# Patient Record
Sex: Female | Born: 1935 | Race: White | Hispanic: No | Smoking: Former smoker
Health system: Southern US, Community
[De-identification: ages and names within clinical notes are randomized; demographics above are authoritative.]

## PROBLEM LIST (undated history)

## (undated) DIAGNOSIS — D649 Anemia, unspecified: Secondary | ICD-10-CM

## (undated) DIAGNOSIS — J449 Chronic obstructive pulmonary disease, unspecified: Secondary | ICD-10-CM

## (undated) DIAGNOSIS — K279 Peptic ulcer, site unspecified, unspecified as acute or chronic, without hemorrhage or perforation: Secondary | ICD-10-CM

## (undated) DIAGNOSIS — E039 Hypothyroidism, unspecified: Secondary | ICD-10-CM

## (undated) HISTORY — DX: Chronic obstructive pulmonary disease, unspecified: J44.9

## (undated) HISTORY — DX: Anemia, unspecified: D64.9

## (undated) HISTORY — DX: Peptic ulcer, site unspecified, unspecified as acute or chronic, without hemorrhage or perforation: K27.9

## (undated) HISTORY — DX: Hypothyroidism, unspecified: E03.9

---

## 2002-03-04 ENCOUNTER — Emergency Department (HOSPITAL_COMMUNITY): Admission: EM | Admit: 2002-03-04 | Discharge: 2002-03-04 | Payer: Self-pay | Admitting: Emergency Medicine

## 2002-07-19 ENCOUNTER — Encounter: Payer: Self-pay | Admitting: Family Medicine

## 2002-07-19 ENCOUNTER — Encounter: Admission: RE | Admit: 2002-07-19 | Discharge: 2002-07-19 | Payer: Self-pay | Admitting: Family Medicine

## 2002-11-25 ENCOUNTER — Inpatient Hospital Stay (HOSPITAL_COMMUNITY): Admission: EM | Admit: 2002-11-25 | Discharge: 2002-11-30 | Payer: Self-pay | Admitting: Emergency Medicine

## 2002-11-26 ENCOUNTER — Encounter: Payer: Self-pay | Admitting: Gastroenterology

## 2003-04-23 ENCOUNTER — Other Ambulatory Visit: Admission: RE | Admit: 2003-04-23 | Discharge: 2003-04-23 | Payer: Self-pay | Admitting: Family Medicine

## 2003-12-27 ENCOUNTER — Inpatient Hospital Stay (HOSPITAL_COMMUNITY): Admission: EM | Admit: 2003-12-27 | Discharge: 2003-12-30 | Payer: Self-pay | Admitting: Emergency Medicine

## 2004-01-06 ENCOUNTER — Encounter: Admission: RE | Admit: 2004-01-06 | Discharge: 2004-01-06 | Payer: Self-pay | Admitting: Family Medicine

## 2004-01-16 ENCOUNTER — Inpatient Hospital Stay (HOSPITAL_COMMUNITY): Admission: EM | Admit: 2004-01-16 | Discharge: 2004-01-20 | Payer: Self-pay | Admitting: Emergency Medicine

## 2004-04-21 ENCOUNTER — Inpatient Hospital Stay (HOSPITAL_COMMUNITY): Admission: EM | Admit: 2004-04-21 | Discharge: 2004-04-25 | Payer: Self-pay | Admitting: Emergency Medicine

## 2004-04-22 ENCOUNTER — Encounter: Payer: Self-pay | Admitting: Cardiology

## 2004-07-08 ENCOUNTER — Ambulatory Visit (HOSPITAL_COMMUNITY): Admission: RE | Admit: 2004-07-08 | Discharge: 2004-07-08 | Payer: Self-pay | Admitting: Specialist

## 2004-08-12 ENCOUNTER — Ambulatory Visit (HOSPITAL_COMMUNITY): Admission: RE | Admit: 2004-08-12 | Discharge: 2004-08-12 | Payer: Self-pay | Admitting: Specialist

## 2004-09-01 ENCOUNTER — Encounter: Admission: RE | Admit: 2004-09-01 | Discharge: 2004-09-01 | Payer: Self-pay | Admitting: Family Medicine

## 2005-03-08 ENCOUNTER — Ambulatory Visit (HOSPITAL_COMMUNITY): Admission: RE | Admit: 2005-03-08 | Discharge: 2005-03-08 | Payer: Self-pay | Admitting: Family Medicine

## 2007-01-10 ENCOUNTER — Inpatient Hospital Stay (HOSPITAL_COMMUNITY): Admission: EM | Admit: 2007-01-10 | Discharge: 2007-01-13 | Payer: Self-pay | Admitting: Emergency Medicine

## 2007-01-27 ENCOUNTER — Encounter: Admission: RE | Admit: 2007-01-27 | Discharge: 2007-01-27 | Payer: Self-pay | Admitting: Family Medicine

## 2007-02-06 ENCOUNTER — Ambulatory Visit: Payer: Self-pay | Admitting: Critical Care Medicine

## 2007-05-05 ENCOUNTER — Ambulatory Visit: Payer: Self-pay | Admitting: Internal Medicine

## 2007-07-13 ENCOUNTER — Encounter: Admission: RE | Admit: 2007-07-13 | Discharge: 2007-07-13 | Payer: Self-pay | Admitting: Family Medicine

## 2008-07-15 ENCOUNTER — Encounter (INDEPENDENT_AMBULATORY_CARE_PROVIDER_SITE_OTHER): Payer: Self-pay | Admitting: *Deleted

## 2008-07-15 ENCOUNTER — Ambulatory Visit: Payer: Self-pay | Admitting: Vascular Surgery

## 2008-07-15 ENCOUNTER — Inpatient Hospital Stay (HOSPITAL_COMMUNITY): Admission: EM | Admit: 2008-07-15 | Discharge: 2008-07-19 | Payer: Self-pay | Admitting: Emergency Medicine

## 2008-08-13 ENCOUNTER — Inpatient Hospital Stay (HOSPITAL_COMMUNITY): Admission: RE | Admit: 2008-08-13 | Discharge: 2008-08-14 | Payer: Self-pay | Admitting: Interventional Radiology

## 2008-08-28 ENCOUNTER — Encounter: Payer: Self-pay | Admitting: Interventional Radiology

## 2009-02-20 ENCOUNTER — Ambulatory Visit: Payer: Self-pay | Admitting: Surgery

## 2009-02-20 ENCOUNTER — Encounter (INDEPENDENT_AMBULATORY_CARE_PROVIDER_SITE_OTHER): Payer: Self-pay | Admitting: Interventional Radiology

## 2009-02-20 ENCOUNTER — Ambulatory Visit (HOSPITAL_COMMUNITY): Admission: RE | Admit: 2009-02-20 | Discharge: 2009-02-20 | Payer: Self-pay | Admitting: Rheumatology

## 2009-08-13 IMAGING — XA IR TRANSCATH EXCRAN VERT OR CAR A STENT
2 of 3 series · 14 of 24 positions shown · non-contrast
Comparison: Angiogram of 07/17/2008.

CLINICAL DATA: Vertebrobasilar ischemia.  Severe bilateral
vertebral artery atherosclerotic stenoses.

RIGHT VERTEBRAL ARTERY EXTRACRANIAL STENT ASSISTED ANGIOPLASTY

[Series 1: run · 13 of 171 slices shown (1 of 2)]
[im 1/171]
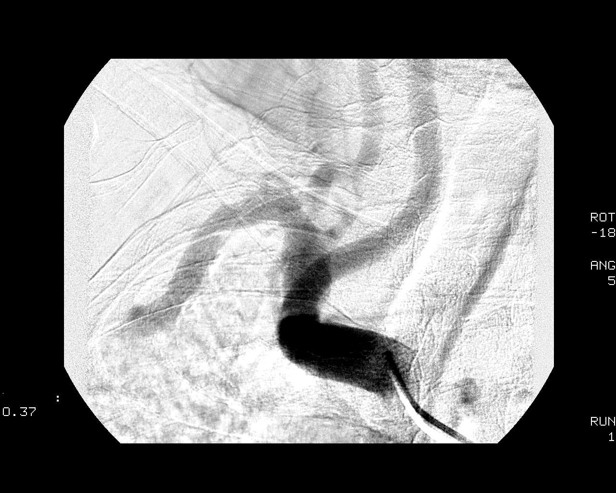
[im 17/171]
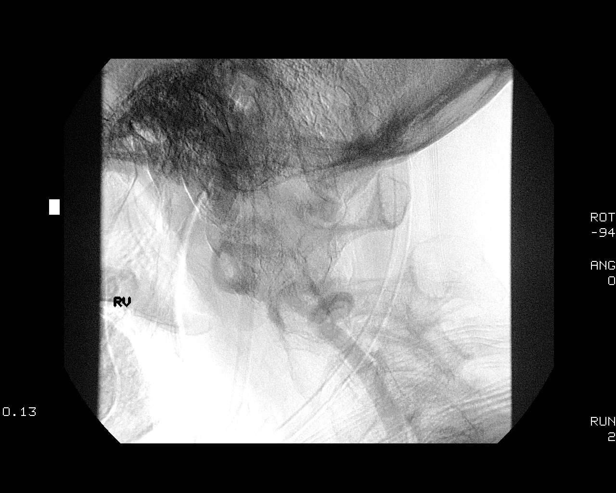
[im 33/171]
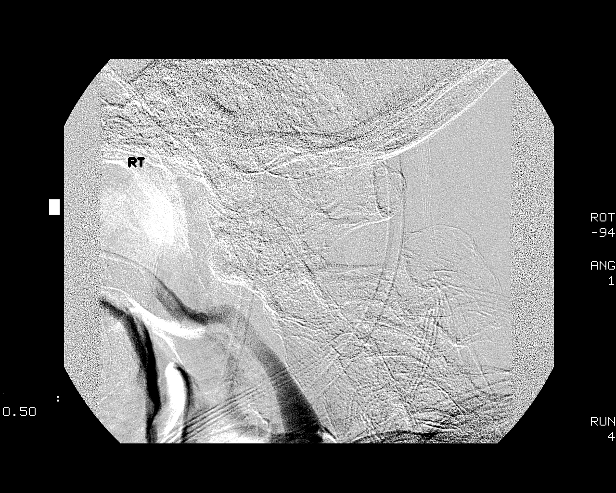
[im 49/171]
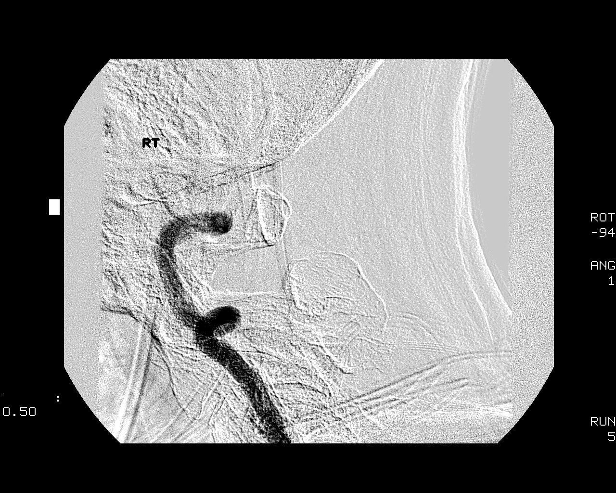
[im 57/171]
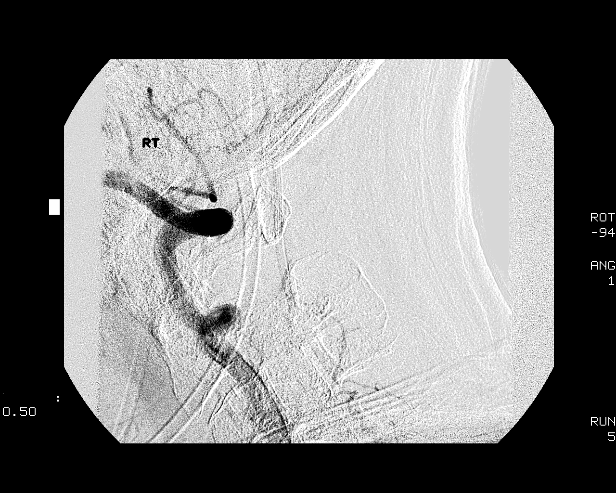
[im 73/171]
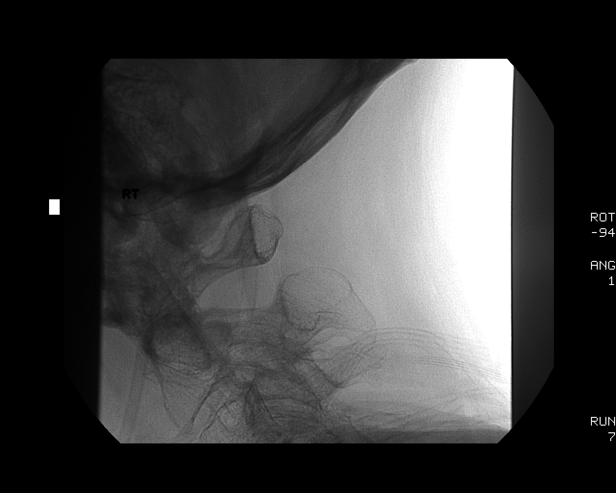
[im 90/171]
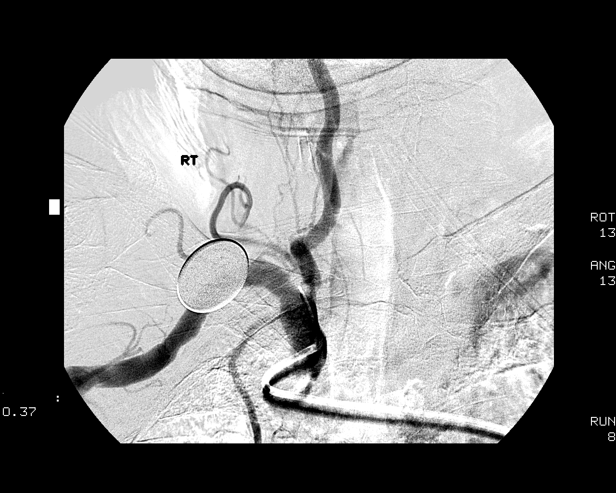
[im 98/171]
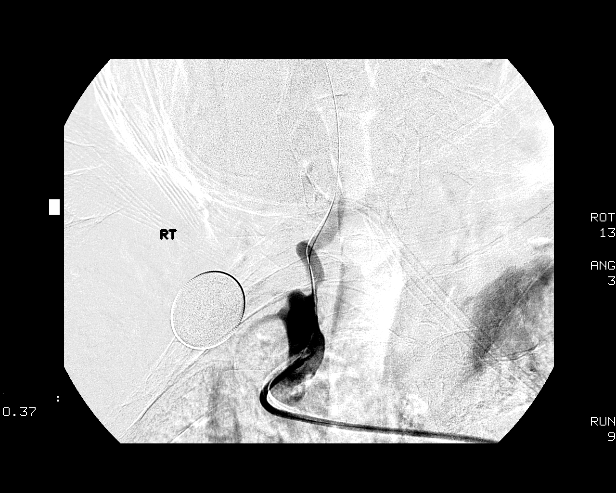
[im 114/171]
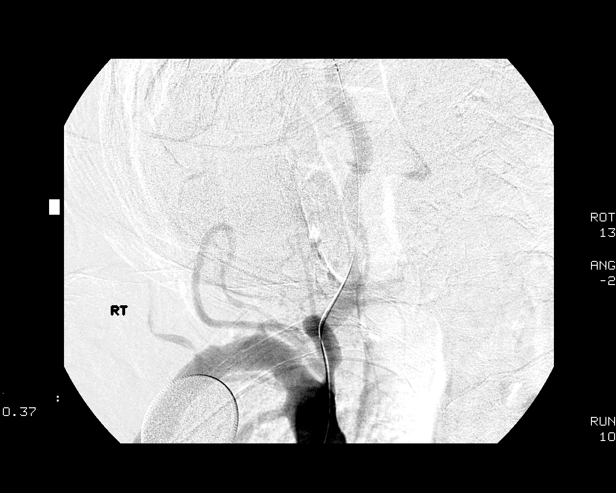
[im 130/171]
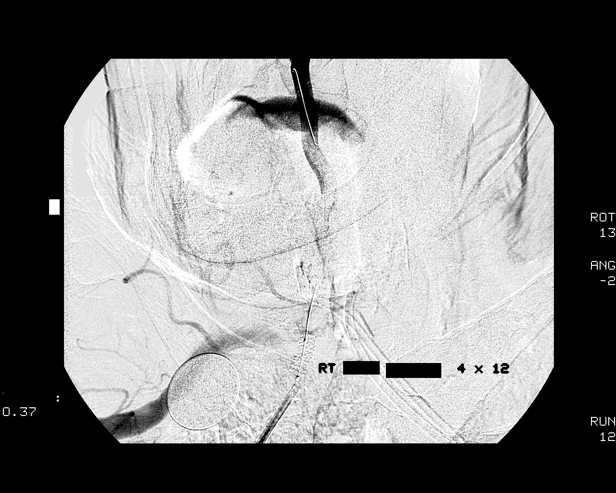
[im 146/171]
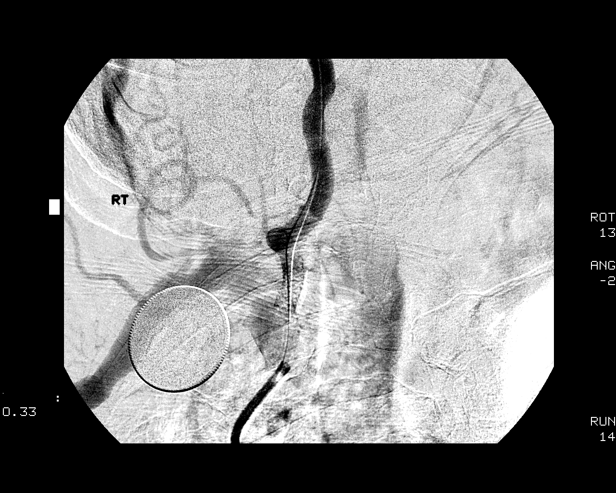
[im 154/171]
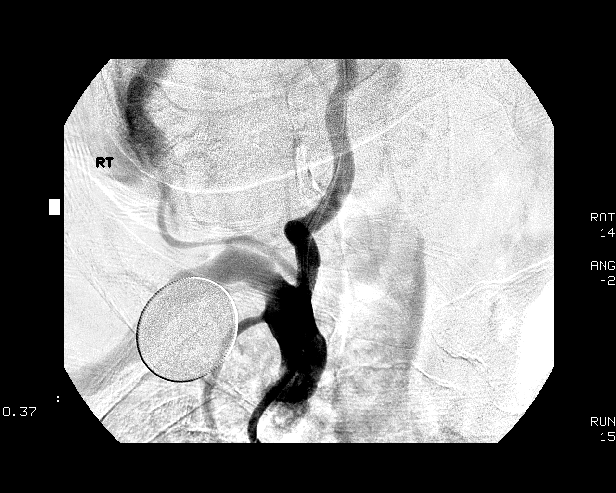
[im 171/171]
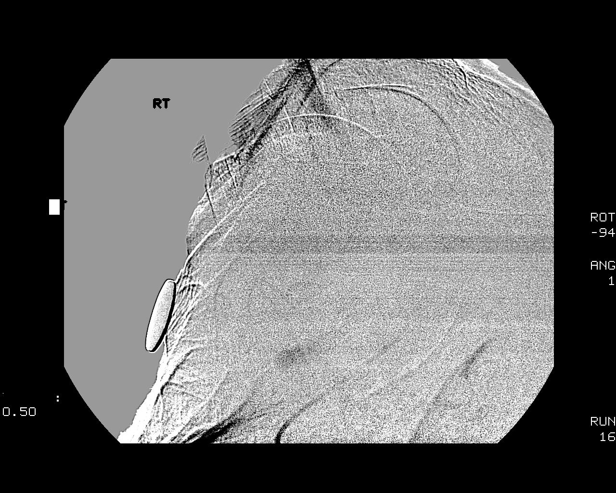

[Series 3: run · 1 of 6 slices shown (2 of 2)]
[im 1/6]
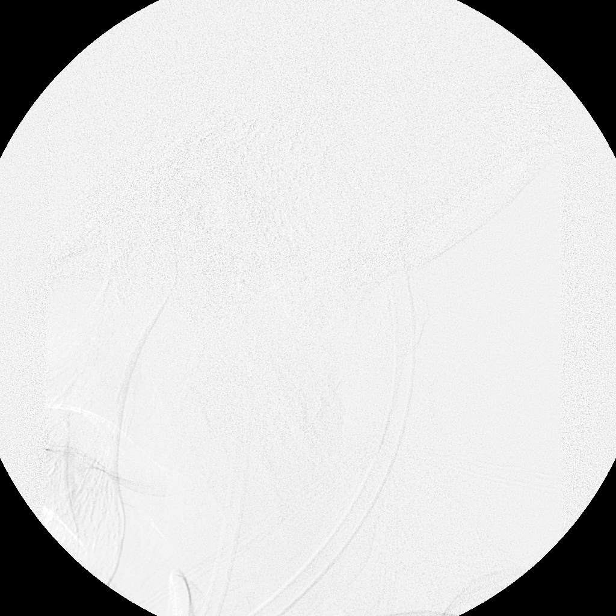

[14 of 24 positions shown; findings below may reference images not displayed]

Following a full explanation of the procedure along with potential
associated complications, an informed witnessed consent was
obtained.

The right groin was prepped and draped in the usual sterile
fashion.  Thereafter using a modified Seldinger technique,
transfemoral access into the right common femoral artery was
obtained without difficulty.  Over a 0.035-inch guidewire, 5-French
Pinnacle sheath was inserted.  Through this and also over a 0.035-
inch guidewire, a 5-French JB1 catheter was advanced to the aortic
arch region and selectively positioned in the right subclavian
artery just proximal to the origin of the right vertebral artery.

An arteriogram was then performed which confirmed the severe focal
stenosis at the origin of the right vertebral artery associated
with post-stenotic dilatation.

Distal flow into the right vertebrobasilar junction and the basilar
artery was noted to be normal.

A quarter was then positioned over the right clavicle medially and
measurements were performed of the right vertebral artery in the
diseased segment proximally.

The vessel measured 4.3 mm distal to the stenosis, just distal to
the post-stenotic dilatation.  The tight focal area measured
mm.  The length of the diseased segment measured less than 10 mm.

Using biplane roadmap technique and constant fluoroscopic guidance,
the diagnostic JB1 catheter was advanced into the distal subclavian
artery and exchanged over a 0.035-inch 300 centimeter Rosen
exchange guidewire for a 6-French 45 cm neurovascular sheath.  The
neurovascular sheath was connected to continuous heparinized saline
infusion.

Over the Rosen exchange guidewire, a 6-French 100 cm Quattrocchi guide
catheter was advanced and positioned with its distal end just
proximal to the origin of the right vertebral artery.

The exchange guidewire was removed.  Good aspiration was obtained
from the hub of the 6-French guide catheter.

A gentle contrast injection demonstrated no evidence of spasm,
dissections or of intraluminal filling defects.

Using biplane roadmap technique and constant fluoroscopic guidance,
in a coaxial manner and with constant heparinized saline infusion,
a Rapid Transit two-marker microcatheter which had been steam-
shaped was advanced over a 0.014-inch Transend Softip
microguidewire to the distal end of the guide catheter.  With the
microguidewire leading with a J-tip configuration to avoid
dissections or inducing spasm, the combination was navigated with
torque device through the tight focal area of stenosis and advanced
into the distal vertical portion of the right vertebral artery.
This was then followed by the microcatheter without difficulty.

The guidewire was removed.  Good aspiration was obtained from the
hub of the microcatheter.  This was then exchanged for a 300 cm
0.014-inch BMW exchange microguidewire.  The microguidewire had a J
configuration to avoid dissections or inducing spasm.

A control arteriogram performed through the 6-French guide catheter
demonstrated no change in the extracranial or intracranial right
vertebral artery flow.

At this time in a coaxial manner and with constant heparinized
saline infusion, using roadmap technique and constant fluoroscopic
guidance, a PROMUS drug eluting stent was advanced using the Rapid
Exchange system to the distal end of the guide catheter.  The
combination was then advanced such that the balloon-mounted stent
was now positioned optimally with the proximal and the distal
markers equidistant from the focal area of severe stenosis.

The stent was then deployed by inflating the balloon via
microtubing and micro inflation device system.  The 4 mm x 12 mm
drug-eluting stent was expanded to 13 atmospheres to a diameter of
4.23 mm.  Once there, it was maintained for approximately 15
seconds.

The balloon was then deflated and retrieved proximally.  A control
arteriogram performed through the 6-French guide catheter
demonstrated excellent apposition of the stent proximally and
distally with significantly improved caliber and flow through the
stented segment.  There was a less than 10% residual stenosis
within the stented portion.

Control arteriograms were then performed at [DATE] and 40 minutes
after positioning of the stent. The exchange microguidewire was
maintained distally.  The control arteriograms continued to
demonstrate excellent flow through the stented portion.  At the end
of 40 minutes, the wire was retrieved under fluoroscopic guidance
to ensure no entanglement with the stent.  None was observed.

The 6-French guide catheter was then retrieved into the abdominal
aorta and exchanged over a J-tip guidewire for a 7-French Pinnacle
sheath.  This was then connected to continuous heparinized saline
infusion.

Throughout the procedure the patient's neurological status remained
stable.  No hemodynamic consequences were noted.  The patient's ACT
was maintained in the 250-300 seconds range using IV heparin.

The patient was then transported to the Neuro ICU for overnight
neurological observations and maintained on IV heparin.

IMPRESSION
1.  Status post stent-assisted angioplasty of severely stenotic
dominant right vertebral artery at its origin, using a balloon
mounted drug-eluting stent.
2.  The patient's overnight stay in the NICU was unremarkable.  The
heparin was stopped the following day.  The right groin sheath was
retrieved and removed.  The patient's neurological and hemodynamic
status remained stable.  Six hours after sheath pull, the patient
was ambulatory and able to eat without difficulty.  She was then
discharged under the care of her daughters.  She will continue to
take her aspirin and Plavix, including her other medications for
now.  She will return to the clinic in 2 weeks.

## 2010-03-25 ENCOUNTER — Ambulatory Visit: Payer: Self-pay | Admitting: Surgery

## 2010-03-25 ENCOUNTER — Ambulatory Visit (HOSPITAL_COMMUNITY): Admission: RE | Admit: 2010-03-25 | Discharge: 2010-03-25 | Payer: Self-pay | Admitting: Family Medicine

## 2010-07-12 ENCOUNTER — Encounter (INDEPENDENT_AMBULATORY_CARE_PROVIDER_SITE_OTHER): Payer: Self-pay | Admitting: Internal Medicine

## 2010-07-15 ENCOUNTER — Encounter (INDEPENDENT_AMBULATORY_CARE_PROVIDER_SITE_OTHER): Payer: Self-pay | Admitting: Internal Medicine

## 2010-07-15 ENCOUNTER — Ambulatory Visit: Payer: Self-pay | Admitting: Vascular Surgery

## 2010-08-20 ENCOUNTER — Inpatient Hospital Stay (HOSPITAL_COMMUNITY): Admission: EM | Admit: 2010-08-20 | Discharge: 2010-08-22 | Payer: Self-pay | Admitting: Emergency Medicine

## 2010-09-13 ENCOUNTER — Inpatient Hospital Stay (HOSPITAL_COMMUNITY): Admission: EM | Admit: 2010-09-13 | Discharge: 2010-09-18 | Payer: Self-pay | Admitting: Internal Medicine

## 2010-09-13 ENCOUNTER — Encounter: Payer: Self-pay | Admitting: Emergency Medicine

## 2010-09-21 ENCOUNTER — Inpatient Hospital Stay (HOSPITAL_COMMUNITY)
Admission: EM | Admit: 2010-09-21 | Discharge: 2010-09-24 | Disposition: A | Discharge status: Other Acute Inpatient Hospital | Payer: Self-pay | Source: Home / Self Care | Attending: Internal Medicine | Admitting: Internal Medicine

## 2010-09-22 ENCOUNTER — Encounter (INDEPENDENT_AMBULATORY_CARE_PROVIDER_SITE_OTHER): Payer: Self-pay | Admitting: Internal Medicine

## 2010-09-23 ENCOUNTER — Encounter (INDEPENDENT_AMBULATORY_CARE_PROVIDER_SITE_OTHER): Payer: Self-pay | Admitting: Internal Medicine

## 2010-09-24 ENCOUNTER — Inpatient Hospital Stay (HOSPITAL_COMMUNITY): Admission: EM | Admit: 2010-09-24 | Discharge: 2010-07-17 | Payer: Self-pay | Admitting: Emergency Medicine

## 2010-12-28 LAB — TROPONIN I: Troponin I: 0.09 ng/mL — ABNORMAL HIGH (ref 0.00–0.06)

## 2010-12-28 LAB — CBC
HCT: 25.2 % — ABNORMAL LOW (ref 36.0–46.0)
HCT: 25.4 % — ABNORMAL LOW (ref 36.0–46.0)
HCT: 25.5 % — ABNORMAL LOW (ref 36.0–46.0)
HCT: 30.3 % — ABNORMAL LOW (ref 36.0–46.0)
Hemoglobin: 8.2 g/dL — ABNORMAL LOW (ref 12.0–15.0)
Hemoglobin: 8.2 g/dL — ABNORMAL LOW (ref 12.0–15.0)
Hemoglobin: 8.3 g/dL — ABNORMAL LOW (ref 12.0–15.0)
Hemoglobin: 9.9 g/dL — ABNORMAL LOW (ref 12.0–15.0)
MCH: 29.4 pg (ref 26.0–34.0)
MCHC: 32.3 g/dL (ref 30.0–36.0)
MCHC: 32.7 g/dL (ref 30.0–36.0)
MCV: 89.9 fL (ref 78.0–100.0)
MCV: 90.4 fL (ref 78.0–100.0)
RBC: 2.82 MIL/uL — ABNORMAL LOW (ref 3.87–5.11)
RDW: 14.5 % (ref 11.5–15.5)
WBC: 10 10*3/uL (ref 4.0–10.5)
WBC: 8.5 10*3/uL (ref 4.0–10.5)
WBC: 9.8 10*3/uL (ref 4.0–10.5)

## 2010-12-28 LAB — CARDIAC PANEL(CRET KIN+CKTOT+MB+TROPI)
CK, MB: 1.3 ng/mL (ref 0.3–4.0)
Total CK: 19 U/L (ref 7–177)
Troponin I: 0.07 ng/mL — ABNORMAL HIGH (ref 0.00–0.06)

## 2010-12-28 LAB — COMPREHENSIVE METABOLIC PANEL
ALT: 37 U/L — ABNORMAL HIGH (ref 0–35)
AST: 49 U/L — ABNORMAL HIGH (ref 0–37)
Alkaline Phosphatase: 80 U/L (ref 39–117)
GFR calc Af Amer: >60 mL/min (ref >60)
Glucose, Bld: 88 mg/dL (ref 70–99)
Potassium: 4 mEq/L (ref 3.5–5.1)
Sodium: 136 mEq/L (ref 135–145)
Total Protein: 4.4 g/dL — ABNORMAL LOW (ref 6.0–8.3)

## 2010-12-28 LAB — URINE CULTURE
Colony Count: NO GROWTH
Culture  Setup Time: 201112060140
Culture: NO GROWTH

## 2010-12-28 LAB — URINALYSIS, ROUTINE W REFLEX MICROSCOPIC
Bilirubin Urine: NEGATIVE
Glucose, UA: NEGATIVE mg/dL
Ketones, ur: NEGATIVE mg/dL
Nitrite: NEGATIVE
pH: 7 (ref 5.0–8.0)

## 2010-12-28 LAB — DIFFERENTIAL
Lymphocytes Relative: 5 % — ABNORMAL LOW (ref 12–46)
Lymphs Abs: 0.7 10*3/uL (ref 0.7–4.0)
Monocytes Absolute: 1 10*3/uL (ref 0.1–1.0)
Monocytes Relative: 8 % (ref 3–12)
Neutro Abs: 10.8 10*3/uL — ABNORMAL HIGH (ref 1.7–7.7)

## 2010-12-28 LAB — BASIC METABOLIC PANEL
CO2: 24 mEq/L (ref 19–32)
Calcium: 9.5 mg/dL (ref 8.4–10.5)
Chloride: 105 mEq/L (ref 96–112)
Chloride: 105 mEq/L (ref 96–112)
GFR calc Af Amer: >60 mL/min (ref >60)
GFR calc non Af Amer: 58 mL/min — ABNORMAL LOW (ref >60)
Glucose, Bld: 103 mg/dL — ABNORMAL HIGH (ref 70–99)
Potassium: 4.1 mEq/L (ref 3.5–5.1)
Sodium: 135 mEq/L (ref 135–145)
Sodium: 136 mEq/L (ref 135–145)

## 2010-12-28 LAB — TYPE AND SCREEN
ABO/RH(D): A POS
Antibody Screen: NEGATIVE

## 2010-12-28 LAB — CULTURE, BLOOD (ROUTINE X 2)
Culture  Setup Time: 201112060904
Culture: NO GROWTH

## 2010-12-28 LAB — IRON AND TIBC
Iron: 29 ug/dL — ABNORMAL LOW (ref 42–135)
Saturation Ratios: 17 % — ABNORMAL LOW (ref 20–55)
TIBC: 173 ug/dL — ABNORMAL LOW (ref 250–470)

## 2010-12-28 LAB — POCT CARDIAC MARKERS: Troponin i, poc: <0.05 ng/mL (ref 0.00–0.09)

## 2010-12-28 LAB — HEPARIN LEVEL (UNFRACTIONATED)
Heparin Unfractionated: 0.2 IU/mL — ABNORMAL LOW (ref 0.30–0.70)
Heparin Unfractionated: 0.45 IU/mL (ref 0.30–0.70)
Heparin Unfractionated: 0.71 IU/mL — ABNORMAL HIGH (ref 0.30–0.70)
Heparin Unfractionated: 0.74 IU/mL — ABNORMAL HIGH (ref 0.30–0.70)

## 2010-12-28 LAB — MAGNESIUM: Magnesium: 1.6 mg/dL (ref 1.5–2.5)

## 2010-12-28 LAB — PROTIME-INR: INR: 1.14 (ref 0.00–1.49)

## 2010-12-28 LAB — CK TOTAL AND CKMB (NOT AT ARMC)
CK, MB: 1.3 ng/mL (ref 0.3–4.0)
Relative Index: INVALID (ref 0.0–2.5)
Total CK: 22 U/L (ref 7–177)

## 2010-12-28 LAB — RETICULOCYTES
RBC.: 3.11 MIL/uL — ABNORMAL LOW (ref 3.87–5.11)
Retic Count, Absolute: 74.6 10*3/uL (ref 19.0–186.0)

## 2010-12-28 LAB — HEMOCCULT GUIAC POC 1CARD (OFFICE): Fecal Occult Bld: NEGATIVE

## 2010-12-28 LAB — TSH: TSH: 5.392 u[IU]/mL — ABNORMAL HIGH (ref 0.350–4.500)

## 2010-12-28 LAB — D-DIMER, QUANTITATIVE: D-Dimer, Quant: 3.22 ug/mL-FEU — ABNORMAL HIGH (ref 0.00–0.48)

## 2010-12-28 LAB — APTT: aPTT: 33 seconds (ref 24–37)

## 2010-12-29 LAB — RETICULOCYTES
Retic Count, Absolute: 27.8 10*3/uL (ref 19.0–186.0)
Retic Ct Pct: 1 % (ref 0.4–3.1)
Retic Ct Pct: 3.9 % — ABNORMAL HIGH (ref 0.4–3.1)

## 2010-12-29 LAB — BASIC METABOLIC PANEL
CO2: 23 mEq/L (ref 19–32)
CO2: 26 mEq/L (ref 19–32)
Calcium: 8.4 mg/dL (ref 8.4–10.5)
Calcium: 8.8 mg/dL (ref 8.4–10.5)
Chloride: 108 mEq/L (ref 96–112)
Chloride: 108 mEq/L (ref 96–112)
Creatinine, Ser: 1.04 mg/dL (ref 0.4–1.2)
GFR calc Af Amer: >60 mL/min (ref >60)
GFR calc Af Amer: 55 mL/min — ABNORMAL LOW (ref >60)
GFR calc Af Amer: 56 mL/min — ABNORMAL LOW (ref >60)
GFR calc Af Amer: >60 mL/min (ref >60)
GFR calc non Af Amer: 45 mL/min — ABNORMAL LOW (ref >60)
GFR calc non Af Amer: 47 mL/min — ABNORMAL LOW (ref >60)
Glucose, Bld: 117 mg/dL — ABNORMAL HIGH (ref 70–99)
Glucose, Bld: 87 mg/dL (ref 70–99)
Potassium: 4.4 mEq/L (ref 3.5–5.1)
Potassium: 4.4 mEq/L (ref 3.5–5.1)
Potassium: 4.6 mEq/L (ref 3.5–5.1)
Potassium: 4.8 mEq/L (ref 3.5–5.1)
Sodium: 140 mEq/L (ref 135–145)
Sodium: 138 mEq/L (ref 135–145)
Sodium: 139 mEq/L (ref 135–145)
Sodium: 141 mEq/L (ref 135–145)
Sodium: 142 mEq/L (ref 135–145)

## 2010-12-29 LAB — COMPREHENSIVE METABOLIC PANEL
ALT: 14 U/L (ref 0–35)
Albumin: 1.9 g/dL — ABNORMAL LOW (ref 3.5–5.2)
Alkaline Phosphatase: 60 U/L (ref 39–117)
BUN: 18 mg/dL (ref 6–23)
BUN: 17 mg/dL (ref 6–23)
Calcium: 9.1 mg/dL (ref 8.4–10.5)
Chloride: 110 mEq/L (ref 96–112)
Glucose, Bld: 96 mg/dL (ref 70–99)
Glucose, Bld: 109 mg/dL — ABNORMAL HIGH (ref 70–99)
Potassium: 4.6 mEq/L (ref 3.5–5.1)
Sodium: 137 mEq/L (ref 135–145)
Total Bilirubin: 0.7 mg/dL (ref 0.3–1.2)
Total Protein: 4.2 g/dL — ABNORMAL LOW (ref 6.0–8.3)
Total Protein: 5.4 g/dL — ABNORMAL LOW (ref 6.0–8.3)

## 2010-12-29 LAB — CBC
HCT: 24.8 % — ABNORMAL LOW (ref 36.0–46.0)
HCT: 24.4 % — ABNORMAL LOW (ref 36.0–46.0)
HCT: 24.6 % — ABNORMAL LOW (ref 36.0–46.0)
HCT: 27.2 % — ABNORMAL LOW (ref 36.0–46.0)
HCT: 28.9 % — ABNORMAL LOW (ref 36.0–46.0)
Hemoglobin: 9.4 g/dL — ABNORMAL LOW (ref 12.0–15.0)
Hemoglobin: 8.3 g/dL — ABNORMAL LOW (ref 12.0–15.0)
Hemoglobin: 9 g/dL — ABNORMAL LOW (ref 12.0–15.0)
Hemoglobin: 9.6 g/dL — ABNORMAL LOW (ref 12.0–15.0)
MCH: 30 pg (ref 26.0–34.0)
MCH: 30.9 pg (ref 26.0–34.0)
MCHC: 32.2 g/dL (ref 30.0–36.0)
MCHC: 33.1 g/dL (ref 30.0–36.0)
MCV: 90.2 fL (ref 78.0–100.0)
MCV: 91.3 fL (ref 78.0–100.0)
MCV: 92.7 fL (ref 78.0–100.0)
MCV: 97.4 fL (ref 78.0–100.0)
Platelets: 254 10*3/uL (ref 150–400)
Platelets: 291 10*3/uL (ref 150–400)
Platelets: 311 10*3/uL (ref 150–400)
Platelets: 314 10*3/uL (ref 150–400)
RBC: 2.75 MIL/uL — ABNORMAL LOW (ref 3.87–5.11)
RBC: 3.16 MIL/uL — ABNORMAL LOW (ref 3.87–5.11)
RBC: 3.03 MIL/uL — ABNORMAL LOW (ref 3.87–5.11)
RBC: 3.13 MIL/uL — ABNORMAL LOW (ref 3.87–5.11)
RDW: 14.3 % (ref 11.5–15.5)
RDW: 14.4 % (ref 11.5–15.5)
RDW: 15.2 % (ref 11.5–15.5)
RDW: 17.4 % — ABNORMAL HIGH (ref 11.5–15.5)
RDW: 18.4 % — ABNORMAL HIGH (ref 11.5–15.5)
WBC: 11.8 10*3/uL — ABNORMAL HIGH (ref 4.0–10.5)
WBC: 11.6 10*3/uL — ABNORMAL HIGH (ref 4.0–10.5)
WBC: 15.1 10*3/uL — ABNORMAL HIGH (ref 4.0–10.5)
WBC: 15.8 10*3/uL — ABNORMAL HIGH (ref 4.0–10.5)
WBC: 8 10*3/uL (ref 4.0–10.5)

## 2010-12-29 LAB — URINALYSIS, ROUTINE W REFLEX MICROSCOPIC
Glucose, UA: NEGATIVE mg/dL
Hgb urine dipstick: NEGATIVE
Specific Gravity, Urine: 1.022 (ref 1.005–1.030)
pH: 6 (ref 5.0–8.0)

## 2010-12-29 LAB — FOLATE: Folate: >20 ng/mL

## 2010-12-29 LAB — DIFFERENTIAL
Basophils Relative: 0 % (ref 0–1)
Eosinophils Absolute: 0.1 10*3/uL (ref 0.0–0.7)
Eosinophils Absolute: 0.2 10*3/uL (ref 0.0–0.7)
Lymphs Abs: 1 10*3/uL (ref 0.7–4.0)
Lymphs Abs: 0.9 10*3/uL (ref 0.7–4.0)
Lymphs Abs: 1.1 10*3/uL (ref 0.7–4.0)
Monocytes Relative: 6 % (ref 3–12)
Monocytes Relative: 9 % (ref 3–12)
Neutro Abs: 9.7 10*3/uL — ABNORMAL HIGH (ref 1.7–7.7)
Neutro Abs: 7.2 10*3/uL (ref 1.7–7.7)
Neutrophils Relative %: 84 % — ABNORMAL HIGH (ref 43–77)
Neutrophils Relative %: 80 % — ABNORMAL HIGH (ref 43–77)
Neutrophils Relative %: 80 % — ABNORMAL HIGH (ref 43–77)

## 2010-12-29 LAB — CROSSMATCH
ABO/RH(D): A POS
ABO/RH(D): A POS
Antibody Screen: NEGATIVE
Unit division: 0
Unit division: 0

## 2010-12-29 LAB — FERRITIN: Ferritin: 153 ng/mL (ref 10–291)

## 2010-12-29 LAB — IRON AND TIBC
Iron: 74 ug/dL (ref 42–135)
Saturation Ratios: 33 % (ref 20–55)
TIBC: 227 ug/dL — ABNORMAL LOW (ref 250–470)

## 2010-12-29 LAB — CARDIAC PANEL(CRET KIN+CKTOT+MB+TROPI)
CK, MB: 1 ng/mL (ref 0.3–4.0)
Troponin I: 0.02 ng/mL (ref 0.00–0.06)

## 2010-12-29 LAB — CULTURE, BLOOD (ROUTINE X 2): Culture: NO GROWTH

## 2010-12-29 LAB — VITAMIN B12: Vitamin B-12: >2000 pg/mL — ABNORMAL HIGH (ref 211–911)

## 2010-12-29 LAB — HEMOCCULT GUIAC POC 1CARD (OFFICE): Fecal Occult Bld: POSITIVE

## 2010-12-29 LAB — PROTIME-INR
INR: 1.04 (ref 0.00–1.49)
Prothrombin Time: 13.8 seconds (ref 11.6–15.2)

## 2010-12-29 LAB — SAMPLE TO BLOOD BANK

## 2010-12-29 LAB — URINE CULTURE

## 2010-12-29 LAB — CK TOTAL AND CKMB (NOT AT ARMC): CK, MB: 1.6 ng/mL (ref 0.3–4.0)

## 2010-12-31 LAB — TYPE AND SCREEN: ABO/RH(D): A POS

## 2010-12-31 LAB — BASIC METABOLIC PANEL
BUN: 18 mg/dL (ref 6–23)
CO2: 24 mEq/L (ref 19–32)
CO2: 28 mEq/L (ref 19–32)
Calcium: 8 mg/dL — ABNORMAL LOW (ref 8.4–10.5)
Calcium: 8.6 mg/dL (ref 8.4–10.5)
Chloride: 108 mEq/L (ref 96–112)
Creatinine, Ser: 1.22 mg/dL — ABNORMAL HIGH (ref 0.4–1.2)
Creatinine, Ser: 1.41 mg/dL — ABNORMAL HIGH (ref 0.4–1.2)
Glucose, Bld: 125 mg/dL — ABNORMAL HIGH (ref 70–99)
Glucose, Bld: 143 mg/dL — ABNORMAL HIGH (ref 70–99)
Sodium: 137 mEq/L (ref 135–145)

## 2010-12-31 LAB — CBC
HCT: 37.5 % (ref 36.0–46.0)
Hemoglobin: 12.3 g/dL (ref 12.0–15.0)
Hemoglobin: 9.8 g/dL — ABNORMAL LOW (ref 12.0–15.0)
MCH: 29.4 pg (ref 26.0–34.0)
MCH: 29.9 pg (ref 26.0–34.0)
MCH: 30 pg (ref 26.0–34.0)
MCH: 30.1 pg (ref 26.0–34.0)
MCHC: 33.2 g/dL (ref 30.0–36.0)
MCHC: 33.9 g/dL (ref 30.0–36.0)
MCHC: 34.1 g/dL (ref 30.0–36.0)
MCV: 86.1 fL (ref 78.0–100.0)
MCV: 88.1 fL (ref 78.0–100.0)
MCV: 88.7 fL (ref 78.0–100.0)
MCV: 89.5 fL (ref 78.0–100.0)
Platelets: 142 10*3/uL — ABNORMAL LOW (ref 150–400)
Platelets: 145 10*3/uL — ABNORMAL LOW (ref 150–400)
Platelets: 211 10*3/uL (ref 150–400)
RBC: 3.1 MIL/uL — ABNORMAL LOW (ref 3.87–5.11)
RBC: 3.1 MIL/uL — ABNORMAL LOW (ref 3.87–5.11)
RBC: 4.19 MIL/uL (ref 3.87–5.11)
RDW: 15.4 % (ref 11.5–15.5)
RDW: 15.4 % (ref 11.5–15.5)
WBC: 8 10*3/uL (ref 4.0–10.5)
WBC: 9.5 10*3/uL (ref 4.0–10.5)

## 2010-12-31 LAB — BASIC METABOLIC PANEL WITH GFR
BUN: 13 mg/dL (ref 6–23)
BUN: 14 mg/dL (ref 6–23)
CO2: 23 meq/L (ref 19–32)
CO2: 24 meq/L (ref 19–32)
Calcium: 8.1 mg/dL — ABNORMAL LOW (ref 8.4–10.5)
Calcium: 8.1 mg/dL — ABNORMAL LOW (ref 8.4–10.5)
Chloride: 103 meq/L (ref 96–112)
Chloride: 105 meq/L (ref 96–112)
Creatinine, Ser: 1.15 mg/dL (ref 0.4–1.2)
Creatinine, Ser: 1.19 mg/dL (ref 0.4–1.2)
GFR calc non Af Amer: 43 mL/min — ABNORMAL LOW
GFR calc non Af Amer: 44 mL/min — ABNORMAL LOW
Glucose, Bld: 106 mg/dL — ABNORMAL HIGH (ref 70–99)
Glucose, Bld: 98 mg/dL (ref 70–99)
Potassium: 4.6 meq/L (ref 3.5–5.1)
Potassium: 4.8 meq/L (ref 3.5–5.1)
Sodium: 132 meq/L — ABNORMAL LOW (ref 135–145)
Sodium: 133 meq/L — ABNORMAL LOW (ref 135–145)

## 2010-12-31 LAB — COMPREHENSIVE METABOLIC PANEL
Albumin: 3.3 g/dL — ABNORMAL LOW (ref 3.5–5.2)
Alkaline Phosphatase: 55 U/L (ref 39–117)
BUN: 14 mg/dL (ref 6–23)
Potassium: 3.9 mEq/L (ref 3.5–5.1)
Sodium: 134 mEq/L — ABNORMAL LOW (ref 135–145)
Total Protein: 5.9 g/dL — ABNORMAL LOW (ref 6.0–8.3)

## 2010-12-31 LAB — TROPONIN I

## 2010-12-31 LAB — CK TOTAL AND CKMB (NOT AT ARMC)
CK, MB: 2.5 ng/mL (ref 0.3–4.0)
Relative Index: INVALID (ref 0.0–2.5)
Total CK: 64 U/L (ref 7–177)

## 2010-12-31 LAB — APTT: aPTT: 37 seconds (ref 24–37)

## 2010-12-31 LAB — DIFFERENTIAL
Basophils Relative: 1 % (ref 0–1)
Monocytes Absolute: 0.7 10*3/uL (ref 0.1–1.0)
Monocytes Relative: 9 % (ref 3–12)
Neutro Abs: 5.8 10*3/uL (ref 1.7–7.7)

## 2010-12-31 LAB — ABO/RH: ABO/RH(D): A POS

## 2011-03-02 NOTE — H&P (Signed)
[name], [name]               ACCOUNT NO.:  [id number]   MEDICAL RECORD NO.:  [id number]          PATIENT TYPE:  OIB   LOCATION:  3104                         FACILITY:  [facility]   PHYSICIAN:  Sanjeev K. Deveshwar, M.D.DATE OF BIRTH:  September 04, 1917   DATE OF ADMISSION:  08/13/2008  DATE OF DISCHARGE:                              HISTORY & PHYSICAL   This patient is an 77 year old female who presented to the hospital with  unsteady gait.  In September 2009, she had an angiogram during that  hospital stay, which showed a right vertebral artery stenosis.  She is  scheduled today for angioplasty and/or stent placement of the right  vertebral artery with Dr. Julieanne Cotton.  The patient understands  the procedure benefits and risks, and is agreeable to proceed.  The  patient is an occasional smoker.   PAST MEDICAL HISTORY:  Pneumonia, COPD, chronic renal insufficiency,  depression, macular degeneration, osteoarthritis, and diverticulitis.   SURGICAL HISTORY:  Appendectomy, hysterectomy, and cholecystectomy.   MEDICATIONS:  Plavix, aspirin, B12, Synthroid, Zoloft, Prilosec, Advair,  Ocuvite, albuterol, fish oil, Lidoderm, and diazepam.   ALLERGIES:  She is allergic to SULFA, CODEINE, and TEQUIN.   PHYSICAL EXAMINATION:  HEART:  Regular rate and rhythm without murmur.  LUNGS:  Clear to auscultation.  ABDOMEN:  Soft.  Positive bowel sounds.  Nontender.  No masses.  GENERAL:  She is alert and oriented x3.  She is appropriate, pleasant 59-  year-old female.  HEENT:  Pupils are equal.  Ocular movements are intact.  EXTREMITIES:  Full range of motion.  Gait is slow, but steady.   ASSESSMENT:  An 77 year old female with unsteady gait and occasional  dizziness.   PLAN:  Repeat cerebral arteriogram with possible angioplasty and/or  stent placement of the right vertebral artery with Dr. Corliss Skains.      [name], P.A.    ______________________________  [name] Corliss Skains,  M.D.    PAT/MEDQ  D:  08/13/2008  T:  08/14/2008  Job:  [id number]

## 2011-03-02 NOTE — Consult Note (Signed)
[name], [name] NO.:  [id number]   MEDICAL RECORD NO.:  [id number]          PATIENT TYPE:  INP   LOCATION:  5524                         FACILITY:  [facility]   PHYSICIAN:  Melvyn Novas, M.D.  DATE OF BIRTH:  [birth year]   DATE OF CONSULTATION:  07/15/2008  DATE OF DISCHARGE:                                 CONSULTATION   This is a patient on the [facility] Team, under the guidance of Dr.  Waymon Amato.  This 77 year old independent living Caucasian female widow,  lives next door to her son and has no significant past medical history.  She states that on Friday, she had a sudden onset of ataxia, may have  been nauseated, but denies any headache and she suddenly noticed this  because she developed problems with her gait.  She felt unstable when  walking.  This resolved spontaneously and then she had a second episode with  similar symptoms on Saturday in p.m.  This was witnessed by her son who  as I said next door neighbor and he brought her to the ER for  evaluation, concerned that his mother may have had a stroke.   PAST MEDICAL HISTORY:  1. Hypothyroidism.  2. Osteoporosis.  3. She used to have peptic ulcers in the 1980s.  4. She has a history of chronic renal insufficiency.  5. Diverticulitis.  6. Anemia.  7. Hypertension.  8. COPD.  9. Asthma.  10.Coronary artery disease.   MEDICATIONS:  Currently Synthroid, Zoloft, cyclobenzaprine, ProAir,  Lidoderm patches, diazepam, Prilosec, Ocuvite, fish oil, Advicor,  albuterol, Citrucel, Mucinex, and vitamin B12.   ALLERGIES:  She has no known drug allergies.   REVIEW OF SYSTEMS:  Positive for being disequilibrium, but not  associated with headaches and at least the second time not associated  with nausea.  No incontinence.  She has poor vision at baseline with  blurring, but has not had fever or retro-orbital pain.  No nausea or  vomiting, no diarrhea, no seizures, no dysarthria, no ataxia, and no  short-term  memory loss.  Had perhaps nausea on Friday, but it did not  last as long as a gait disorder.   SOCIAL HISTORY:  She is an 77 year old widow living independently next  to her son.   FAMILY HISTORY:  Hypertension and reflux disease.   LABORATORY RESULTS:  The patient has a troponin of 0.02 with CK-MB of  73.  PTT was 37.  PT/INR was 14.9 and 1.1.  Urinalysis showing normal  clear urine.  Metabolic panel shows a sodium of 134, glucose of 109, BUN  15, creatinine 1.33, and glomerular filtration rate around 40.  Troponin  was measured at 0.03 nine hours prior to the first quoted result and  multiple repetitive cardiac markers do not show evidence of cardiac  injury.  Lipase was 24 and CBC with differential showed a white blood  cell count 15.4, H&H of 14.1/42.1, and a platelet count of 237.   RADIOLOGY IMAGES:  The patient had a CT of the head obtained through the  ER last night.  This was negative.  A  CT of the chest was also obtained.  She had an MRI of the head without contrast for syncope and dizziness  evaluation that was then compared to the CT scan from the ER and showed  some remote hemorrhagic old ganglia lacunes are present, subcortical  white matter changes that are allowed for 77 years of age patient.  She  has also right vertebral vascular dominance.  Findings suspicious, but  nonconclusive for a proximal basilar artery dissection, bilateral  supraclinoid ICA irregularities, right more than left was also noted by  Dr. Davonna Belling.   Physical therapy.  The patient walked today with a physical therapist  and she found no residual gait deficit.   PHYSICAL EXAMINATION:  MENTAL STATUS:  Alert and oriented x3.  Fluent  speech.  No arthralgia.  No dysarthria.  Mild difficulties with hearing  bilaterally and decreased visual acuity.  The patient is not able to  write and has no longer the ability to read normal newspaper print.  Cranial nerve examination shows difficulties with  downward gaze.  She  seems to have a blind spot in the center.  She has no facial numbness,  but she has a very subtle left facial lower droop.  Tongue and uvula  were midline, and there is no sensory loss over the face or body.  Finger-nose-finger test was dysmetric on the right.  She had normal grip  strength and normal deep tendon reflexes.  Her plantar pedis strength  for dorsiflexion and plantarflexion identical.  She had regular and  symmetric muscle tone for the triceps, biceps, and quadriceps.  No  rigor.  No cogwheeling.   ASSESSMENT:  I am not sure that this MRI technique is truly sufficient  to call a basilar artery dissection.  My concern is that this could be  an overcall, however, to expose the patient to a dye study with a  traditional angiogram would also mean risking her kidney function.  I  would like Dr. Pearlean Brownie tomorrow to comment on this.  My main  recommendation for this patient today is to start a baby aspirin, which  she has not been taken yet and to have a Hemoccult once a month or so  through her family doctor to catch if there is any blood loss through  the gastrointestinal system.  I would not suggest any aggressive  treatment.  The patient may want to discuss her wishes with her son.  I  do not see any fresh evidence of infarction, no tumor, no fresh bleed.  Family doctor for this patient was not named.       Melvyn Novas, M.D.  Electronically Signed     CD/MEDQ  D:  07/15/2008  T:  07/16/2008  Job:  [id number]   cc:   Pramod P. Pearlean Brownie, MD  Marcellus Scott, MD  Pollyann Savoy, MD

## 2011-03-02 NOTE — H&P (Signed)
NAMEFREDERICK, KLINGER NO.:  [id number]   MEDICAL RECORD NO.:  [id number]          PATIENT TYPE:  INP   LOCATION:  1827                         FACILITY:  [facility]   PHYSICIAN:  Lucita Ferrara, MD         DATE OF BIRTH:  1917/05/25   DATE OF ADMISSION:  07/14/2008  DATE OF DISCHARGE:                              HISTORY & PHYSICAL   The patient is an 77 year old with nausea, ataxia, unsteady gait and  difficulty in ambulation.  No changes in her cognition or speech.  She  also has had intermittent dizziness and nausea, but no vomiting.  She  feels like her head is swimming.  She denies that the room is spinning  around her, however.  She denies any focal neurological deficits,  weakness, numbness.  She has no slurred speech.  She has no difficulty  in swallowing.  She has no history of cerebrovascular accident.  She has  had a chronic cough, SOB, N/V, but no fevers, sick contacts.   PAST MEDICAL HISTORY:  Significant for:  1. Coronary artery disease.  2. Asthma.  3. Chronic obstructive pulmonary disease.  4. Hypertension.  5. Anemia.  6. Diverticulitis.  7. Gastroesophageal reflux disease.  8. Hyperthyroidism.  9. Osteoporosis.  10.Peptic ulcer disease.  11.Renal insufficiency.   ALLERGIES:  No known drug allergies.   MEDICATIONS AT HOME:  1. Synthroid.  2. Zoloft.  3. Cyclobenzaprine.  4. ProAir.  5. Lidoderm.  6. Diazepam.  7. Prilosec.  8. Ocuvite.  9. Fish oil.  10.Advicor.  11.Albuterol.  12.Citrucel.  13.Mucinex.  14.Vitamin B12.   REVIEW OF SYSTEMS:  As per HPI, otherwise negative.   PHYSICAL EXAMINATION:  GENERAL:  Generally speaking, the patient is in  no acute distress.  HEENT: Normocephalic, atraumatic.  Sclerae are anicteric.  Neck supple.  No JVD or carotid bruits.  PERRLA.  Extraocular muscles intact.  CARDIOVASCULAR:  S1 and S2, regular rate and rhythm.  No murmurs, rubs  or clicks.  LUNGS: Clear to auscultation bilaterally.  No  rhonchi, rales or wheeze.  ABDOMEN: Soft, nontender.  NEURO:  Grossly intact.  Strength intact bilaterally.   EKG:  Normal sinus rhythm.  No ST-T wave changes.  Urinalysis within  normal limits.  Basic metabolic panel negative.  Troponin less than  0.03.  Chest x-ray shows COPD with emphysematous changes.  No focal  consolidation, questionable right upper lobe mass. Further evaluation  with CT to rule out malignancy.  CT scan of the head:  Severe chronic  ischemic white matter changes, atrophy and old bilateral basal ganglia  lacunar infarcts.  INR 1.1.  KUB shows nonobstructing bowel gas pattern,  lumbar scoliosis and old granulomatous changes.  Basic metabolic panel  shows a BUN of 15, creatinine 1.40, lipase 24.   ASSESSMENT/PLAN:  An 77 year old with history of cerebrovascular disease  and diffuse white matter disease on CT scan presents with ataxia,  dizziness and feeling that she is swimming.  Symptoms are nonfocal.  1. Dizziness and ataxia, rule out cerebrovascular accident versus      transient ischemic  attack, probable posterior circulation      deficiency.  2. Chronic obstructive pulmonary disease, compensated.  3. Stage III chronic kidney disease, stable and baseline.  4. Chest X-Ray shows questionable right upper lobe mass.   DISCUSSION AND PLAN:  In review of [place] pulmonary office notes dated  February 06, 2007, the patient has been previously evaluated for the mass.  CT scan of the chest obtained during that time shows a satellite opacity  that was not really measurable at that time.  Per radiology  recommendations, she will likely benefit from CT scan of the chest.  We  will go ahead and admit the patient to the Medical Telemetry Unit.  We  will monitor her neurological status with serial neurological  examination and we will do neuro checks.  We will proceed with MRI, MRA  of the brain if her kidney function can tolerate Gadolinium.  We will  get bilateral carotid  Dopplers.  We will proceed with strict fall  precautions.  The rest of the plans are really dependent on her  progress.  We will get appropriate consultations; Neurology versus  Pulmonary consultation depending on her results.      Lucita Ferrara, MD  Electronically Signed     RR/MEDQ  D:  07/15/2008  T:  07/15/2008  Job:  [id number]

## 2011-03-02 NOTE — Discharge Summary (Signed)
NAMETOMECA, HELM               ACCOUNT NO.:  [id number]   MEDICAL RECORD NO.:  [id number]          PATIENT TYPE:  INP   LOCATION:  3104                         FACILITY:  [facility]   PHYSICIAN:  Sanjeev K. Deveshwar, M.D.DATE OF BIRTH:  1917-06-02   DATE OF ADMISSION:  08/13/2008  DATE OF DISCHARGE:  08/14/2008                               DISCHARGE SUMMARY   DIAGNOSIS:  Right vertebral artery stenosis.   PROCEDURE DONE:  Right vertebral artery angioplasty and stent placement.   HOSPITAL COURSE:  This patient is an 77 year old female who presented to  the hospital with an unsteady gait in September 2009.  She had an  angiogram done during that hospital stay which showed the right  vertebral artery stenosis.  She was then consulted by Dr. Julieanne Cotton and scheduled for a right vertebral artery angioplasty and/or  stent placement for August 13, 2008.  This procedure was done as an  outpatient, and she was admitted overnight to Dr. Corliss Skains following  the procedure.  The patient has done quite well during her stay in Neuro  ICU, she was admitted to room 3104.  During that time, her overnight  stay was uneventful.  She has had no nausea, vomiting, feeding well,  drinking well, sleeping well.  She had no complaints, no headache, no  blurred vision, no neurologic symptoms at all.   PAST MEDICAL HISTORY:  1. Pneumonia.  2. COPD.  3. Chronic renal insufficiency.  4. Depression.  5. Macular degeneration.  6. Osteoarthritis.  7. Diverticulitis.   MEDICATIONS:  Plavix, aspirin, B12, Synthroid, Zoloft, Prilosec, Advair,  Ocuvite, albuterol, fish oil, Lidoderm, and diazepam.   ALLERGIES:  She is allergic to SULFA, CODEINE, AND TEQUIN.   LABORATORY DATA:  On August 14, 2008, hemoglobin 10, hematocrit 29.9,  repeat H&H were stable at 10.3 and 30.4, BUN of 9, creatinine 1.1.  Sodium 139, potassium 3.9.   PHYSICAL EXAMINATION:  HEART:  Regular rate and rhythm without murmur.  LUNGS:  Clear to auscultation.  ABDOMEN:  Soft.  Positive bowel sounds.  Nontender.  No masses.  GENERAL:  She is alert and oriented x3.  She is appropriate and pleasant  77 year old female.  HEENT:  Pupils are equal and reactive.  EXTREMITIES:  Have full range of motion.  Her gait is steady.  Right  groin is nontender.  No hematoma.  No bleeding.  Clean and dry.  The  right foot has Dopplerable pulses, but is warm and pink.  VITAL SIGNS:  Stable.  Blood pressures in the 120s.   ASSESSMENT:  Right vertebral artery stenosis with right vertebral artery  angioplasty and stent placement by Dr. Corliss Skains on August 13, 2008.   PLAN:  Discharge today after flat bedrest of 6 hours.  She is to follow  up with Dr. Corliss Skains on August 28, 2008, at 3 p.m., which is  Wednesday.  She is to continue aspirin and Plavix and all other  medications.  She should refrain from bending, stooping, and lifting for  2 weeks.  Be restful at home for the next 3-4 days.  She is  to call Dr.  Corliss Skains if she has any problems or develops any new symptoms.  She  leaves here today with good understanding of this plan and instructions.  Her condition is stable and improved.      [place] [facility] [name], P.A.    ______________________________  [place] Silos Corliss Skains, M.D.    PAT/MEDQ  D:  08/14/2008  T:  08/15/2008  Job:  [id number]

## 2011-03-02 NOTE — Consult Note (Signed)
NAMEALEXA, [name]               ACCOUNT NO.:  [id number]   MEDICAL RECORD NO.:  [id number]          PATIENT TYPE:  OIB   LOCATION:  3104                         FACILITY:  [facility]   PHYSICIAN:  Ladell Pier, M.D.   DATE OF BIRTH:  [birth year]   DATE OF CONSULTATION:  DATE OF DISCHARGE:                                 CONSULTATION   CHIEF COMPLAINT:  None.  Asked to follow along with the patient.  The  patient was admitted for vertebral artery angioplasty and stenting.   HISTORY OF PRESENT ILLNESS:  The patient is an 77 year old white female  that was recently admitted secondary to nausea, ataxia, unsteady gait,  difficulty with ambulation.  The patient was noted to have vertebral  artery stenosis.  She was readmitted today for stenting.  The patient is  without any complaints today.  She had her procedure done earlier, she  feels fine.  No complaints.   PAST MEDICAL HISTORY:  1. COPD.  2. Vertebral insufficiency secondary to vertebral artery stenosis.  3. Chronic kidney disease stage III.  4. Chronic normocytic anemia.  5. Depression.  6. Macular degeneration.  7. Osteoporosis.  8. History of diverticulosis.  9. Status post appendectomy.  10.Status post hysterectomy.  11.Status post cholecystectomy.  12.Hypothyroidism.   FAMILY HISTORY:  Noncontributory.   SOCIAL HISTORY:  She smokes about two cigarettes per day.  No alcohol  use.  She is widowed.  She has three children.  She is retired.   MEDICATIONS:  1. Zoloft 50 mg daily.  2. Prilosec 20 mg daily.  3. Advair 250/50 twice daily.  4. Albuterol 1 puff three times daily.  5. Hemocyte Plus daily.  6. Ocuvite daily.  7. Synthroid 100 mg daily.  8. Calcium and vitamin D daily.  9. Lidoderm patch as needed.  10.Valium 2 mg twice a day as needed.  11.Aspirin 81 mg daily.  12.Plavix 75 mg daily.   ALLERGIES:  NONE.   REVIEW OF SYSTEMS:  Negative, otherwise stated in the HPI.   PHYSICAL EXAMINATION:  VITAL SIGNS:   Temperature 98.5, pulse of 69,  blood pressure 132/54, respirations 22, pulse ox 97% on 2 liters.  GENERAL:  The patient is lying flat in bed, does not seem to be in any  acute distress.  HEENT:  Normocephalic, atraumatic.  Pupils reactive to light.  Throat  without erythema.  CARDIOVASCULAR:  Regular rate and rhythm.  LUNGS:  Clear bilaterally.  ABDOMEN:  Soft, nontender, nondistended.  Positive bowel sounds.  EXTREMITIES:  Without edema.   LABORATORY DATA:  Sodium 140, potassium 4.4, chloride 108, CO2 of 25,  glucose 99, BUN 12, creatinine 1.32, calcium 9.2.   ASSESSMENT/PLAN:  1. Vertebral artery stenosis, status post angioplasty stenting.  2. Chronic kidney disease.  3. Normocytic anemia.  4. Depression.  5. Osteoporosis.  6. Hypothyroidism.  7. Hypertension.  8. Gastroesophageal reflux disease.  9. Coronary artery disease.   The patient was admitted to the hospital.  She had her procedure today.  She was continued on her home medications.  Also, she has been fairly  stable.  Blood pressure is fairly stable.  Will continue all her  medications as this was done by interventional radiology, and will  continue to follow along with you.  Thanks for the consult.      Ladell Pier, M.D.  Electronically Signed     NJ/MEDQ  D:  08/13/2008  T:  08/13/2008  Job:  [id number]

## 2011-03-02 NOTE — Consult Note (Signed)
[name], [name]               ACCOUNT NO.:  [id number]   MEDICAL RECORD NO.:  [id number]          PATIENT TYPE:  OUT   LOCATION:  XRAY                         FACILITY:  [facility]   PHYSICIAN:  Sanjeev K. Deveshwar, M.D.DATE OF BIRTH:  June 24, 1917   DATE OF CONSULTATION:  DATE OF DISCHARGE:                                 CONSULTATION   DATE OF CONSULT:  August 28, 2008.   CHIEF COMPLAINT:  Status post stent-assisted angioplasty of the right  vertebral artery on August 13, 2008.   HISTORY OF PRESENT ILLNESS:  This is a very pleasant 77 year old female  who was admitted to [facility] from July 14, 2008, to July 19, 2008.  The patient  initially presented with nausea, ataxia, unsteady gait, and difficulty  with ambulation.  A neurology consult was obtained from Dr. Vickey Huger.  Dr. 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  A cerebral angiogram  was recommended.  Dr. Corliss Skains performed a cerebral angiogram on  July 17, 2008.  This showed a 95% focal stenosis of the dominant  right vertebral artery at its origin and just distally.  The patient  also had an occluded left vertebral artery in the neck.  There was a  mild fusiform prominence of the proximal basilar artery, probably  atherosclerotic in etiology.  She also had a 50% stenosis of the  dominant inferior division of the right middle cerebral artery.  It was  felt that the patient would benefit from stenting of the right vertebral  artery.  During that hospitalization, the patient was also treated for  pneumonia.  Arrangements were made to have the patient return to [facility] on August 13, 2008, to undergo a stent-assisted  angioplasty of the right vertebral artery.  The procedure was performed.  The patient tolerated it well.  She returns today accompanied by her  daughter to be  seen in followup.   Past medical history is significant for pneumonia with COPD, a history  of vertebral insufficiency with the above noted lesion.  She has chronic  kidney disease felt to be stage III, chronic normocytic anemia, a  history of sinus tachycardia felt to be secondary to her Synthroid dose  which was decreased.  She has a history of hypothyroidism, a history of  depression, macular degeneration, osteoporosis, diverticulosis, and a  history of coronary artery disease, details not available.  The patient  is legally blind due to her macular degeneration.   On July 15, 2008, she had a CT scan of the chest which showed a 3.5-  cm mass-like consolidation in the posterior right upper lobe.  It was  felt that this might possibly represent pneumonia or a possible  neoplasm.  The patient also had a slight increase in the mediastinal  lymph nodes which was felt to be nonspecific.  The patient was treated  for pneumonia during that hospital stay.   ALLERGIES:  The patient is allergic to SULFA, CODEINE, and TEQUIN.   Current medications  include aspirin 81 mg daily and Plavix 75 mg daily.  The patient reports that she was recently started on Lipitor for by her  primary care physician.  She is also on B12, Synthroid, Zoloft,  Prilosec, Advair, Ocuvite, albuterol, fish oil, Lidoderm, and diazepam.   SOCIAL HISTORY:  The patient is widowed.  She has 3 children.  She  currently lives alone and provides her own self-care.  She continues to  smoke several cigarettes per day.  She does not drink alcohol.  She  lives in [place].   Family history is noncontributory.   IMPRESSION AND PLAN:  As noted, the patient returns today accompanied by  her daughter to be seen in follow up by Dr. Corliss Skains after undergoing a  stent-assisted angioplasty of the right vertebral artery on August 13, 2008.  The patient and her daughter report that she has had no further  episodes of dizziness  or unsteady gait.  She continues to live alone.  She is anxious to return to her normal level of activity which includes  housework.  Dr. Corliss Skains recommended that she refrain from any  housework for at least 2 more weeks.  He also recommended that the  patient continue on her aspirin and Plavix along with her other  medications.  An ultrasound will be performed in approximately 3 months  to evaluate the patency of the stent.  A followup visit will be  scheduled at that time.  The patient was told to call in the interim if  she has any new neurologic-type symptoms.  Greater than 10 minutes was  spent on this consult.      Delton See, P.A.    ______________________________  Grandville Silos. Corliss Skains, M.D.    DR/MEDQ  D:  08/28/2008  T:  08/29/2008  Job:  [id number]   cc:   Broadus John T. Pamalee Leyden, MD  Pramod P. Pearlean Brownie, MD

## 2011-03-05 NOTE — Discharge Summary (Signed)
[name], [name]                           ACCOUNT NO.:  [id number]   MEDICAL RECORD NO.:  [id number]                   PATIENT TYPE:  INP   LOCATION:  3727                                 FACILITY:  [facility]   PHYSICIAN:  John C. Madilyn Fireman, M.D.                 DATE OF BIRTH:  05/06/1917   DATE OF ADMISSION:  01/16/2004  DATE OF DISCHARGE:  01/20/2004                                 DISCHARGE SUMMARY   HISTORY OF PRESENT ILLNESS:  The patient is an 77 year old white female who  was admitted on March 31 with black stools, fall in hemoglobin indicative of  an upper GI bleed.  She had a presenting hemoglobin of 6.6 and dark, heme-  positive stools.  For details, please see admission history and physical.   HOSPITAL COURSE:  The patient was initially transfused two units of packed  red blood cells with a rise in hemoglobin to 4.9.  She underwent an EGD on  4/1 which showed a superficial erosion in the duodenal bulb which was  injected with 8 cubic centimeters of epinephrine.  The patient was started  on Protonix and a third unit of packed red blood cells was transfused.  On  April 2, her hemoglobin was 10.4 and her vital signs were stable.  She was  having a cramp in her left leg which resolved the next day.  Her diet was  advanced.  Her proton pump inhibitor was switched to the p.o. route.  On  April 3, her hemoglobin was 10.6, and she was tolerating her diet with  normalization of the appearance of her stools.  On April 4, her hemoglobin  was 11.2 and she was felt ready for discharge.  She was discharged on her  admission medicines with the addition of quinine for leg cramps and  Protonix.  She was discharged to follow up at Dr. Madilyn Fireman in two weeks.   DISCHARGE DIAGNOSIS:  Duodenal ulcer with hemorrhage.   CONDITION ON DISCHARGE:  Improved.                                                John C. Madilyn Fireman, M.D.    JCH/MEDQ  D:  02/12/2004  T:  02/13/2004  Job:  [id number]   cc:   Clydie Braun L.  Hal Hope, M.D.  [address] [place]  [place] [address]  Fax: [phone]

## 2011-03-05 NOTE — H&P (Signed)
NAMEKOSHA, JAQUITH NO.:  [id number]   MEDICAL RECORD NO.:  [id number]          PATIENT TYPE:  INP   LOCATION:  1824                         FACILITY:  [facility]   PHYSICIAN:  Elliot Cousin, M.D.    DATE OF BIRTH:  04/21/1917   DATE OF ADMISSION:  01/10/2007  DATE OF DISCHARGE:                              HISTORY & PHYSICAL   PRIMARY CARE PHYSICIAN:  Dr. Ernestina Penna.   CHIEF COMPLAINT:  Shortness of breath and cough.   HISTORY OF PRESENT ILLNESS:  The patient is an 77 year old woman with a  past medical history significant for emphysema, hypertension and  hypothyroidism who presents to the emergency department with a chief  complaint of shortness of breath and cough.  Her symptoms started  approximately 1-1/2 weeks ago.  Her symptoms have become progressively  worse over the past 3 days.  The cough was initially dry and hacking.  It is now productive with clear and off-colored sputum.  She has had  associated shortness of breath but no pleurisy or chest pain.  She has  had generalized weakness.  No fevers or chills.  No nausea, vomiting or  diarrhea.  No abdominal pain.  She has had her flu shot.   Dr. Christell Constant ordered an outpatient CT scan of the chest without contrast on  January 02, 2007.  The preliminary impression reads small stellate opacity  in the superior segment of the left lower lobe, scattered nodule  densities, patchy infiltrate within the right lower lobe with right  lower lobe bronchial wall thickening, chronic hyperinflation/emphysema  with chronic obliterative bronchiolitis, etc.  The chest x-ray in the  emergency department tonight reveals right lower lobe infiltrate, COPD  and chronic bronchitis.  The patient will therefore be admitted for  further evaluation and management.   PAST MEDICAL HISTORY:  1. Emphysema/COPD.  2. Hypertension.  3. Depression with anxiety.  4. Blind in the right eye and a decrease in visual acuity in the left  eye secondary to macular degeneration.  5. History of diverticulosis.  6. History of GI bleed secondary to diverticulosis.  7. Cardiac strain secondary to anemia in July of 2005 as a consequence      of GI bleed.  8. Chronic anemia.  9. Degenerative joint disease.  10.Osteoporosis.  11.Chronic kidney disease.  12.Hypothyroidism.  13.History of left clavicle fracture.  14.Status post hysterectomy.  15.Status post appendectomy.  16.Status post cholecystectomy.   MEDICATIONS:  1. Metoprolol 25 mg daily.  2. Zoloft 50 mg daily.  3. Hydrochlorothiazide 12.5 mg daily.  4. Prilosec 20 mg daily.  5. Cyclobenzaprine 10 mg p.r.n.  6. Advair 250/50 mg b.i.d.  7. Hemocyte Plus once daily.  8. Ocuvite 1 tablet daily.  9. Albuterol MDI 2 puffs q.6h. p.r.n.  10.Synthroid 125 mcg daily.  11.Milk of Magnesia as needed.  12.Calcium plus D once daily.  13.Lidoderm patch daily as needed.  14.Valium 2 mg b.i.d. p.r.n.   ALLERGIES/INTOLERANCES:  1. NEURONTIN which causes an upset stomach.  2. TEQUIN which causes nausea and vomiting.  3. VIOXX which causes edema.  4. CELEBREX which causes stomach ulcer.   SOCIAL HISTORY:  The patient is widowed.  She lives alone.  Her son,  Elnita [place], lives next door.  She does not drive.  She still smokes 3-4  cigarettes per day.  However, she has a long history of smoking a half a  pack to 1 pack of cigarettes per day.  She denies alcohol use.  She is  retired from [employer] and [employer].   FAMILY HISTORY:  Her parents are deceased.   PHYSICAL EXAMINATION:  Temperature 98.5, blood pressure 121/77, pulse  90, respiratory rate 17, oxygen saturation 96% on 2 liters of nasal  cannula oxygen.  GENERAL:  The patient is a pleasant 77 year old alert Caucasian woman  who is currently sitting up in bed in no acute distress.  HEENT:  Head is normocephalic and atraumatic.  Pupils are equal, round,  and reactive to light.  Extraocular movements  intact.  Conjunctivae are  clear.  Sclerae are white.  Nasal mucosa is dry.  No sinus tenderness.  Oropharynx reveals a full set of dentures.  Mucus membranes are mildly  dry.  No posterior exudates or erythema.  Neck is supple.  No  adenopathy, no bruit, no JVD.  LUNGS:  Scattered crackles and occasional wheezes bilaterally.  Breathing is nonlabored.  The patient demonstrated several wet coughs  during the exam without expectoration.  HEART:  S1-S2 with a soft systolic murmur.  ABDOMEN:  Mildly obese, positive bowel sounds, soft, nontender, and  nondistended.  No hepatosplenomegaly, no masses palpated.  EXTREMITIES:  Pedal pulses barely palpable.  No pretibial edema and no  pedal edema.  NEUROLOGICAL:  The patient is alert and oriented x3.  Cranial nerves II  through XII intact.   ADMISSION LABORATORIES:  EKG pending.  Chest x-ray and CT scan results  are above.  WBC 8.9, hemoglobin 12.8, hematocrit 37.8, platelets 273.  Sodium 136, potassium 4.6, chloride 101, CO2 25, glucose 96, BUN 25,  creatinine 1.45.  Calcium 9.3, total protein 6.4, albumin 3.4, AST 22.   ASSESSMENT:  1. Right lower lobe pneumonia superimposed on chronic bronchitis and      emphysema.  Although the patient is not febrile and her white blood      cell count is within normal limits, her symptoms have been      progressive and admission for intravenous antibiotics is warranted.  2. Scattered lung nodules per CT scan of the chest.  Follow-up CT scan      of the chest in 4 months was recommended by the radiologist.  This      will be deferred to Dr. Christell Constant.  3. Renal insufficiency:  The patient has a known history of chronic      kidney disease, although her BUN and creatinine at baseline are      unknown at this time.  4. Hypertension:  The patient's blood pressure is well within normal      limits.   PLAN: 1. The patient will be admitted for further evaluation and management.  2. We will check blood cultures  and a sputum specimen for culture and      sensitivity.  3. Follow-up outpatient CT scan of the chest per Dr. Christell Constant.  4. We will check a baseline EKG.  5. We will hold the beta blocker secondary to wheezes.  6. Tobacco cessation counseling.  7. We will start antibiotic treatment with Rocephin and azithromycin.  8. We will add  Atrovent and albuterol nebulizers, p.r.n. Robitussin      and twice daily Mucinex.  Oxygen therapy as well.      Elliot Cousin, M.D.  Electronically Signed     DF/MEDQ  D:  01/11/2007  T:  01/11/2007  Job:  [id number]   cc:   Ernestina Penna, M.D.

## 2011-03-05 NOTE — Consult Note (Signed)
NAMEDAYLAN, [name] NO.:  [id number]   MEDICAL RECORD NO.:  [id number]                   PATIENT TYPE:  INP   LOCATION:  3727                                 FACILITY:  [facility]   PHYSICIAN:  Cassell Clement, M.D.              DATE OF BIRTH:  [birth year]   DATE OF CONSULTATION:  04/22/2004  DATE OF DISCHARGE:                                   CONSULTATION   REFERRING PHYSICIAN:  Rebecca L. Spaulding, M.D.   CHIEF COMPLAINT:  Abnormal EKG and weakness.   HISTORY:  This is an 77 year old woman we had seen in our office in 1999.  At that time, she was seen 4 days after having experienced some substernal  chest pressure.  Her electrocardiogram at the time we saw her showed deep  anterolateral T wave inversion.  She was reluctant at that time to enter the  hospital, and was followed as an outpatient, and her electrocardiogram  remained abnormal with deep T wave changes.  She subsequently underwent an  adenosine Cardiolite study on December 20, 1997, and it showed no evidence of  ischemia, and showed no wall motion abnormalities, and had an ejection  fraction of 68% with an excellent myocardial perfusion demonstrated.  She  has not been experiencing any recent chest pain.  She has been on treatment  as an outpatient for her hypertension with Micardis.  She has not been on  beta blockers at home.  She recently, for the past 3-4 days, had felt  progressive weakness, and came in and was found to have anemia with a  hemoglobin of 8, and guaiac positive stools.  Of note is the fact that she  was admitted here 3 months ago in March of 2005 for an upper GI bleed, and  at that time her hemoglobin dropped to 6.6.   SOCIAL HISTORY:  She is a widow.  She lives alone, but her children live  next door.  She no longer is able to drive because of decreased vision.  She  does smoke about a half a pack of cigarettes a day, and has smoked for the  past 25-30 years.   REVIEW  OF SYSTEMS:  Essentially negative, except for the present illness.   PHYSICAL EXAMINATION:  VITAL SIGNS:  Blood pressure is 116/60, pulse 81 and  regular, respirations 18, temperature 98.  HEAD/NECK:  Decreased vision in both eyes.  The carotids are normal.  Jugular venous pressure normal.  CHEST:  Clear.  HEART:  A quiet precordium without murmur, gallop, rub, or click.  ABDOMEN:  Soft and nontender.  EXTREMITIES:  Cellulitis of the right leg from a recent injury.  There is no  peripheral edema.   LABORATORY DATA:  Labs of note include a hemoglobin of 8.  Troponins are  0.8, 0.54, and 0.41, with CK-MB of 7.6, 6.2, and 6.1.  Her electrocardiogram  shows widespread T wave  inversion, but in comparing it with the old office  EKG of 1999, there is no significant change.   DIAGNOSTIC IMPRESSION:  Probable small non-Q wave myocardial infarction  occurring in the setting of hemodynamic instability and stress of a  recurrent GI bleed.  The __________ enzyme rise suggests a very small  infarction.  Of note, the EKG is essentially identical to the EKG of 1999.   A 2-D echo was done today and reported in the chart and shows significant  left ventricular hypertrophy with septal hypertrophy and vigorous basilar  contraction and apical hypokinesis, consistent with apical ballooning  syndrome.   RECOMMENDATIONS:  Would pursue the work-up and remedy of her recurrent GI  bleed.  Would avoid aspirin and anticoagulants in the face of GI bleed.  Once the blood pressure is stabilized in terms of her anemia and transfusion  requirements, would consider adding a small dose of beta blocker such as  Toprol-XL 25 once a day, and would consider long-term beta blocker therapy  for hypertension, and left ventricular hypertrophy with septal hypertrophy  and systolic anterior __________ of the mitral valve.  Cardiac  catheterization is not indicated at this time.  Smoking cessation is very  important.                                                Cassell Clement, M.D.    TB/MEDQ  D:  04/22/2004  T:  04/22/2004  Job:  [id number]   cc:   Saul Fordyce, N.P.  [employer] C. Madilyn Fireman, M.D.  [address]  [place]  [place] [address]  Fax: [phone]   Randon Goldsmith. Jolinda Croak, M.D.  Fax: [phone]

## 2011-03-05 NOTE — Discharge Summary (Signed)
[name], [name]               ACCOUNT NO.:  [id number]   MEDICAL RECORD NO.:  [id number]          PATIENT TYPE:  INP   LOCATION:  4728                         FACILITY:  [facility]   PHYSICIAN:  Hillery Aldo, M.D.   DATE OF BIRTH:  05/26/1917   DATE OF ADMISSION:  01/10/2007  DATE OF DISCHARGE:  01/13/2007                               DISCHARGE SUMMARY   PRIMARY CARE PHYSICIAN:  Dr. Ernestina Penna.   DISCHARGE DIAGNOSES:  1. Acute infectious exacerbation of chronic obstructive pulmonary      disease.  2. Hypertension.  3. Mild chronic normocytic anemia.  4. Stage III chronic kidney disease.  5. Hypothyroidism.  6. Ongoing tobacco abuse.  7. Lung nodules, follow-up CT scan in 4 months recommended.  8. History of depression with anxiety.  9. Macular degeneration.  10.History of diverticulosis.  11.Degenerative joint disease.  12.Osteoporosis.  13.Mild renal insufficiency.   DISCHARGE MEDICATIONS:  1. Zoloft 50 mg daily.  2. Metoprolol 25 mg daily.  3. Hydrochlorothiazide 12.5 mg daily.  4. Prilosec 20 mg daily.  5. Cyclobenzaprine 10 mg q.8h. p.r.n.  6. Advair 250/50 one inhalation q.12h.  7. Hemocyte Plus 1 tablet daily.  8. Ocuvite 1 tablet daily.  9. Albuterol 2 puffs q.2h. p.r.n. wheezing.  10.Milk of Magnesia p.r.n.  11.Calcium plus D 600 mg daily.  12.Lidoderm patch daily as needed.  13.Valium 2 mg b.i.d. p.r.n.  14.Prednisone taper 60 mg to off.  15.Azithromycin 250 mg daily for three more days.  16.Mucinex 600 mg b.i.d.  17.Synthroid 125 mcg daily.   CONSULTATIONS:  None.   BRIEF ADMISSION HISTORY OF PRESENT ILLNESS:  The patient is an 77-year-  old female who presented to the emergency department with complaint of  shortness of breath and productive cough.  Her shortness of breast had  been progressive over the previous 1-2 weeks.  The patient did have  evidence of a right lower lobe process and chronic bronchitis and was  therefore admitted for  further evaluation and treatment.   For the full details of HPI, please see the dictated report done by Dr.  Sherrie Mustache.   PROCEDURES AND DIAGNOSTIC STUDIES:  Chest x-ray on January 10, 2007,  showed a right lower lobe pneumonia, changes of COPD, and chronic  bronchitis.   DISCHARGE LABORATORY VALUES:  Sodium was 134, potassium 4.8, chloride  100, bicarb 25, BUN 21, creatinine 1.27, glucose 89.  White blood cell  count was 14.2, hemoglobin 11.2, hematocrit 32.9, platelets 289.   HOSPITAL COURSE BY PROBLEM:  PROBLEM #1 - RIGHT LOWER LOBE PNEUMONIA  WITH ACUTE EXACERBATION OF CHRONIC OBSTRUCTIVE PULMONARY DISEASE:  The  patient was admitted and empirically put on Rocephin and azithromycin.  She initially did not have any elevation of her white blood cell count.  Due to ongoing bronchospasm, the patient was put on IV Solu-Medrol.  This was rapidly tapered.  She has now completed 3 days of treatment of  Rocephin and azithromycin and will be discharged on an additional 3 days  of azithromycin p.o.  She states she is breathing much better.  Her bump  in her white blood cell count is thought to be secondary to  demargination from steroid effect.  The patient was also put on  nebulized bronchodilator therapy and Mucinex.  She is encouraged to  continue taking Mucinex if needed on discharge.   PROBLEM #2 - HYPERTENSION:  The patient's beta blocker was held during  the course of hospitalization due to bronchospasm.  Her blood pressure  remained well controlled despite being off both her beta blocker and her  hydrochlorothiazide, which was held secondary to her mild renal  insufficiency.  The patient's blood pressure has not required any  further treatment.  She is instructed to hold both her  hydrochlorothiazide and her metoprolol until she follows up with her  primary care physician to see if these are still needed.   PROBLEM #3 - MILD CHRONIC NORMOCYTIC ANEMIA:  The patient does have a  mild  chronic normocytic anemia.  This was not further evaluated as she  takes iron supplements.  Further outpatient evaluation can be done by  her primary care physician.   PROBLEM #4 -  STAGE III CHRONIC KIDNEY DISEASE:  The patient does have  reduced glomerular filtration rate consistent with stage III chronic  kidney disease.  Her creatinine improved with gentle IV fluid hydration  over the course of her hospitalization.  She can follow up with her  primary care physician for ongoing management and consideration of  referral to a nephrologist.   PROBLEM #5 - HYPOTHYROIDISM:  The patient's TSH was checked and she was  found to be appropriately replaced on her current dose of Synthroid  which was continued during the course of her hospitalization.   PROBLEM #6 - TOBACCO ABUSE:  The patient did received formal tobacco  cessation counseling.  She is not at a place where she wishes to quit at  this time.  She was given Transport planner and contact information  should she change her mind.   PROBLEM #7 - LUNG NODULES:  The patient did have a CT scan done as an  outpatient which showed lung nodules.  A follow-up CT scan is  recommended in 4 months time to ensure stability.   DISPOSITION:  The patient is stable for discharge home.  She will be  discharged today after she receives her dose of antibiotic therapy and  one additional dose of Solu-Medrol.  She should follow up with her  primary care physician, Dr. Rudi Heap, early next week.      Hillery Aldo, M.D.  Electronically Signed     CR/MEDQ  D:  01/13/2007  T:  01/13/2007  Job:  [id number]   cc:   Ernestina Penna, M.D.

## 2011-03-05 NOTE — Op Note (Signed)
   [name], [name]                           ACCOUNT NO.:  [id number]   MEDICAL RECORD NO.:  [id number]                   PATIENT TYPE:  INP   LOCATION:  5038                                 FACILITY:  [facility]   PHYSICIAN:  John C. Madilyn Fireman, M.D.                 DATE OF BIRTH:  October 02, 1917   DATE OF PROCEDURE:  02/07/2003  DATE OF DISCHARGE:  11/30/2002                                 OPERATIVE REPORT   PROCEDURE PERFORMED:  Esophagogastroduodenoscopy   INDICATIONS FOR PROCEDURE:  Melanic stools and elevated BUN suggesting upper  GI bleed.   DESCRIPTION OF PROCEDURE:  The patient was placed in the left lateral  decubitus position and placed on the pulse monitor with continuous low flow  oxygen delivered by nasal cannula.  She was sedated with 25 mg IV Demerol  and 7.5 mg of IV Versed.  The Olympus video endoscope was advanced under  direct vision into the oropharynx and esophagus.  The esophagus was straight  and of normal caliber.  At the squamocolumnar line  at 36 cm about a 2 cm  hiatal hernia.  There is no visible esophagitis, ring, stricture, or other  abnormality at the GE junction.  The stomach was entered and a small amount  of liquid secretions were suctioned from the fundus.  There were no coffee  grounds seen.  Retroflexed view of the cardia confirmed the hiatal hernia  and was otherwise unremarkable.  The fundus and body appeared normal.  The  antrum showed small erosions with no stigma of hemorrhage and generalized  erythema consistent with antral gastritis.  A CLOtest was obtained.  The  pylorus was nondeformed and easily allowed passage of the endoscope tip into  the duodenum.  Both the bulb and second portion were well inspected and  appeared to be within normal limits.  The scope was then withdrawn and the  patient returned to the recovery room in stable condition.  She tolerated  the procedure well and there were no immediate complications.   IMPRESSION:  1. Antral  gastritis.  2. Hiatal hernia.   PLAN:  Will await CLOtest and treat empirically for peptic ulcer disease.  May need small bowel endoscopy or possibly even colonoscopy if bleeding  persists.                                               John C. Madilyn Fireman, M.D.    JCH/MEDQ  D:  02/07/2003  T:  02/07/2003  Job:  [id number]   cc:   Thomas C. Wall, M.D.

## 2011-03-05 NOTE — H&P (Signed)
   [name], [name]                           ACCOUNT NO.:  [id number]   MEDICAL RECORD NO.:  [id number]                   PATIENT TYPE:  EMS   LOCATION:  MAJO                                 FACILITY:  [facility]   PHYSICIAN:  John C. Madilyn Fireman, M.D.                 DATE OF BIRTH:  Jan 03, 1917   DATE OF ADMISSION:  11/25/2002  DATE OF DISCHARGE:                                HISTORY & PHYSICAL   CHIEF COMPLAINT:  Weakness and black stools.   HISTORY OF PRESENT ILLNESS:  The patient is an 77 year old white female with  3-4 day history of worsening weakness, dizziness, and orthostasis with black  stools noted in the last two days. She has had some mild nausea but no  vomiting and no abdominal pain. She has not had any episodes of near  syncope. She was found to have black heme positive stools and a hemoglobin  of 7 with a BUN of 86 and creatinine of 1.0 here in the emergency room. Her  initial blood pressure was 68/40 although she was alert and oriented. Her  blood pressure responded to fluid bolus with subsequent systolic blood  pressure of around 90. She has never had any prior GI bleeding.   PAST MEDICAL HISTORY:  Hypothyroidism, arthritis.   PAST SURGICAL HISTORY:  Remote appendectomy and cholecystectomy.   MEDICATIONS:  Actonel once week, Synthroid 100 mcg once daily, Celebrex 200  mg per day, aspirin 81 mg per day.   ALLERGIES:  SULFA AND CODEINE.   SOCIAL HISTORY:  The patient is a widow. She lives alone. She has a 50 pack  year history of cigarette smoking. Denies alcohol use.   FAMILY HISTORY:  Father died of cancer of the lung. Mother died of natural  causes.   PHYSICAL EXAMINATION:  GENERAL: A well developed, well nourished, pale white  female in no acute distress. Alert and oriented times four.  HEENT: Unremarkable.  HEART: Regular rate and rhythm. No murmur, rub, or gallop.  ABDOMEN: Soft and nondistended with normal active bowel sounds. No  hepatosplenomegaly. No  mass or guarding.   LABORATORY DATA:  Hemoglobin 7.5, WBC 14,400, platelets 239,000. PT 14.5.  BUN 86, creatinine 1.1.    IMPRESSION:  Likely upper GI bleed, probably secondary to peptic ulcer  disease.   PLAN:  Admit, hydrate, transfusion and will proceed with EGD tonight.                                               John C. Madilyn Fireman, M.D.    JCH/MEDQ  D:  11/25/2002  T:  11/25/2002  Job:  [id number]   cc:   Nicki Guadalajara, M.D.  [phone], [address]  [place], [place] [address]  Fax: [phone]

## 2011-03-05 NOTE — Assessment & Plan Note (Signed)
[facility]                             PULMONARY OFFICE NOTE   NAME:[name], [name] BOUTON                      MRN:          [id number]  DATE:02/06/2007                            DOB:          11-23-1916    CHIEF COMPLAINT:  Evaluate abnormal CT scan.   HISTORY OF PRESENT ILLNESS:  This is an 77 year old, white female who  had symptoms of URI in March of 2008.  Abnormal chest x-ray obtained at  that time showing infiltrate right lower lobe.  Subsequent CT scan  showed patchy infiltrate right lower lobe with bronchial inflammation  and thickening, and a left lower lobe stellate lesion in the superior  segment.  She is referred for further evaluation.  Currently, she is  having no chest pain, sinus complaints.  No real mucus production.  No  reflux symptoms.  She smoked five cigarettes daily for 40 years, quit  smoking three weeks ago.  She has had pneumonia many years ago.  She is  short of breath now with activity, not at rest.  She is having no  hemoptysis, chest pain, no acid reflux symptoms, no loss of appetite,  change in weight.  She denies abdominal pain, difficulty swallowing,  sore throat, headaches, nasal congestion, sneezing, itching, earache.  She is referred for further evaluation.   PAST MEDICAL HISTORY:  1. Asthma and emphysema in the past.  2. History of cholecystectomy in 1980.  3. Hysterectomy in 1981.   MEDICATION ALLERGIES:  None.   CURRENT MEDICATIONS:  1. Synthroid 125 mcg daily.  2. Zoloft 50 mg daily.  3. Prilosec over-the-counter 20 mg daily.  4. Cyclobenzaprine 10 mg daily.  5. Advair 250/50 one spray b.i.d.  6. Hematinic plus vitamins daily.  7. Ocuvite daily.  8. Lidoderm patch daily.  9. Calcium daily.  10.Albuterol p.r.n.  11.Diazepam 2 mg p.r.n.   SOCIAL HISTORY:  Smoking history as noted above.  She is retired and  lives alone, is widowed.   FAMILY HISTORY:  Asthma in her father.  Rheumatism in her mother.  Father had lung cancer and stomach cancer.   REVIEW OF SYSTEMS:  Otherwise noncontributory.   PHYSICAL EXAMINATION:  GENERAL:  This is an elderly, white female in no  distress.  VITAL SIGNS:  Temperature 98.  Blood pressure 98/60.  Pulse 82.  Saturation 95 percent on room air.  CHEST:  Distant breath sounds with prolonged expiratory phase.  No  wheeze or rhonchi were noted.  CARDIAC:  Regular rate and rhythm without S3.  Normal S1, S2.  ABDOMEN:  Soft, nontender.  EXTREMITIES:  No edema or clubbing, no venous disease.  SKIN:  Clear.  NEUROLOGIC:  Intact.  HEENT:  No jugular venous distention or lymphadenopathy.  Oropharynx  clear.  Neck supple.   CT scan of the chest was obtained and reviewed, and was revealed from a  January 02, 2007 study showing thin stellate opacity almost not entirely  measurable in the left lower lobe superior segment.  There is coarse,  patchy infiltrate in the right lower lung with bronchial wall  thickening.  No other abnormalities are really seen.   IMPRESSION:  Left lower lobe scar, likely benign in nature.  Bronchitis  with pneumonic changes in the right lower lobe, now clearing, and  associated chronic obstructive pulmonary disease, asthmatic, bronchitic,  emphysematous component.   RECOMMENDATIONS:  Followup CT scan on May 05, 2007.  No other pulmonary  followup or treatment is necessary beyond this.  Once the results of  this are available, further recommendations will follow.  No change in  inhaled medications are made.     Charlcie Cradle Delford Field, MD, [facility]  Electronically Signed    PEW/MedQ  DD: 02/06/2007  DT: 02/06/2007  Job #: [id number]   cc:   Ernestina Penna, M.D.

## 2011-03-05 NOTE — H&P (Signed)
[name], [name] NO.:  [id number]   MEDICAL RECORD NO.:  [id number]                   PATIENT TYPE:  INP   LOCATION:  3727                                 FACILITY:  [facility]   PHYSICIAN:  Ace Gins, MD                  DATE OF BIRTH:  May 15, 1917   DATE OF ADMISSION:  04/21/2004  DATE OF DISCHARGE:                                HISTORY & PHYSICAL   PRIMARY CARE PHYSICIAN:  [name].   CARDIOLOGIST:  [name], M.D.   CHIEF COMPLAINT:  Weakness.   HISTORY OF PRESENT ILLNESS:  [name] is an 77 year old white female who  presented to her primary care physician today for a follow up of right lower  extremity cellulitis.  She has also complained of severe weakness x4 days,  stated she was not feeling well since she had cut her leg one week ago, but  had been doing normal activities until Saturday four days ago when she  complained of weakness and too tired to do her housework and cooking.  The  family noticed the change and called her PCP, decided to wait until the  office opened on Tuesday, April 21, 2004.  She was seen in the office, had EKG  changes, and was sent to the emergency department after a 2 L normal saline  bolus.   PAST MEDICAL HISTORY:  1. History of a GI bleed x2, the last in March 2005.  2. Anemia.  Hemoglobin of 11.2 on January 20, 2004, status post transfusion.  3. Arthritis, especially low back.  4. Right lower extremity cut and injured one week ago, status post seven     days of antibiotics.  5. Macular degeneration.  6. Asthma with questionable COPD.  7. Hypothyroidism.  8. Osteoporosis.  9. History of hypertension.   PAST SURGICAL HISTORY:  1. Total abdominal hysterectomy.  2. Appendectomy.  3. Cholecystectomy.   ALLERGIES:  1. NEURONTIN.  2. TEQUIN.   CURRENT MEDICATIONS:  1. Micardis 80 mg p.o. daily x6 months, stopped today by PCP.  2. Zoloft 50 mg p.o. daily.  3. Actonel every  week.  4. Albuterol p.r.n., uses every 2 to 3 days.  5. Advair 250/50 b.i.d.  6. Synthroid 100 mcg p.o. daily.  7. Flexeril 10 mg q.h.s.  8. Protonix 40 mg p.o. daily.  9. Keflex, finished seven day course yesterday.  10.      Rocephin 1 g, received one IM dose on April 21, 2004, at PCP.  11.      Milk of magnesia p.r.n. constipation.  12.      Ocuvite multivitamin daily.   FAMILY HISTORY:  Mother died at age 78 of pneumonia, had a history of CVA.  Father died at age 67 of cancer of lungs and stomach.  Sister died at age  46, unknown cause of death.  Daughter age 61  with diabetes, CVA, history of  MI at 17.  Son alive at 7, status post CAD, status post heart cath.  Denies  MI.  Another daughter age 82, healthy.   SOCIAL HISTORY:  Lives alone in [place] with children living next door.  Does her own ADL's.  Tobacco:  Four to five cigarettes per day x40+ years.  Denies alcohol.   REVIEW OF SYSTEMS:  GENERAL:  Denies fever.  Has had weight loss of 25  pounds over the past 1-1/2 years.  Complains of general tiredness and  weakness, no focality.  CARDIOVASCULAR:  No chest pain or pressure, no  palpitations.  No shortness of breath, dyspnea, cough.  GASTROINTESTINAL:  Has chronic constipation.  Her last bowel movement was yesterday.  Chronic  dark tarry stools.  Nausea x2 days.  Denies emesis.  GENITOURINARY:  Denies  burning pain or frequency on urination.  HEENT:  Has dentures, they set  poorly.  She is blind in her right eye, almost blind in left.  Denies  headache, dizziness.  Positive episodes of near syncope three or four days  ago.   PHYSICAL EXAMINATION:  VITAL SIGNS:  Temperature 96.9, blood pressure  112/45, pulse 84, respirations 18, 100% 2 L per minute.  Orthostatics:  Laying 96/30 with a pulse of 82, sitting 102/48 with a pulse of 95, standing  98/54 with a pulse of 75.  GENERAL:  She is in no acute distress.  Pleasant elderly female.  Alert and  oriented x3.   Cooperative.  HEENT:  PERRLA.  EOMI.  TM's clear and WNL.  Oropharynx with no exudate or  erythema.  MMM.  Dentures upper and lower.  The patient refused to remove.  NECK:  Supple, no thyromegaly, lymphadenopathy, or bruits.  Positive JVD.  CARDIOVASCULAR:  RRR.  Occasional PAC's on telemetry.  Systolic 2/6 murmur  best in left upper sternal border.  Positive peripheral pulses.  LUNGS:  Crackles bilateral bases.  No increased work of breathing.  Good air  movement bilaterally.  ABDOMEN:  Positive BS, soft, NT, ND, no HSM.  Overweight.  RECTAL:  Good tone.  Light green stool.  Hemoccult positive.  EXTREMITIES:  Right lower extremity with skin breakdown, erythema, area of  scab anterior tibial.  Thinning skin, +1 pulses.  NEUROLOGIC:  Cranial nerves II-XII intact.  No focal deficits.  Strength is  5/5 in bilateral upper and lower extremities, equal bilaterally.  Gait with  short swing phase, mild wide base.   LABORATORY DATA:  WBC 9.2, hemoglobin 9.5, hematocrit 28.1, platelets 249,  MCV 84.5.  __________pH of 7.29, PCO2 44.2.  Sodium 140, potassium 4.2,  chloride 109, bicarbonate 20.6, BUN 32, creatinine 1.8, glucose 110.  Point-  of-care cardiac markers:  Myoglobin from February 2005, was 144, CK-MB 5.4  to 3.4, troponin-I 0.27 to 0.21.  EKG:  T-waves flipped inferiorly and  laterally as compared with March 2005.  Chest x-ray with mild cardiac  enlargement, stable prominent upper mediastinum, cannot rule out goiter or  paratracheal adenopathy, no pneumonia.   ASSESSMENT AND PLAN:  [name] is an 77 year old white female with  hypotension and weakness x4 days.  1. Cardiac.  Unsure of history.  Has seen Dr. Patty Sermons, cardiologist,     before.  Placed only on aspirin.  Denies any stress testing.  Denies     current chest pain or pressure.  Has weakness and EKG changes, elevated     cardiac enzymes which suggest subacute cardiac event.  We will continue    checking cardiac enzymes  q.8h.Marland Kitchen  Recheck EKG in the a.m.  Discussed with     cardiology who agrees only change in management would be anticoagulation,     but with a history of gastrointestinal bleed and current heme positive     stools, we will hold anticoagulation for now.  We will see cardiology in     the morning.  Risks for coronary artery disease, include smoking,     positive family history, lipid profile unknown.  We will check in the     morning.  Also check echocardiogram as this is unknown and the patient is     status post 2.6 L of fluid with jugular venous distention and crackles.     We will set intravenous fluids to [facility].  2. Anemia.  Hemoglobin 9.5 today, has decreased from 11.2 in April 2005.     Previous history of anemia felt secondary to recurrent gastrointestinal     bleed.  Currently, is heme positive stools. We will consult     gastrointestinal, follow hemoglobin and hematocrit, and transfuse if less     than 8, and depending on cardiac status.  Continue Protonix 40 mg daily.     We will check old records for anemia workup.  Likely a component of iron-     deficiency.  We will consider iron supplementation.  3. Hypothyroidism.  We will check TSH.  Noted questionable goiter on chest x-     ray.  4. Mediastinal lymphadenopathy versus goiter on chest x-ray.  We will     discuss with team and radiology need for followup with CT scan.  5. Asthma.  Continue Advair b.i.d. and albuterol p.r.n.  Questionable     chronic obstructive pulmonary disease component with tobacco use.  6. Right lower extremity cellulitis, status post seven days of Keflex and     one dose of Rocephin on April 21, 2004.  Peripheral vascular disease making     it difficult to heal.  We will assess in the a.m.  Place Una boot for     increased healing versus wound care, and consult.  7. Hypertension.  Previously been on medications.  Currently hypotensive     secondary to #1.  We will hold Micardis.  Would prefer to place the      patient on beta blocker, but with relative hypotension, we will hold off     for now and follow.  8. Acute on chronic renal insufficiency.  Baseline creatinine at 1.4 in     April 2005.  Currently 1.8.  Possibly secondary to pre-renal causes.  We     will recheck after hydration in the a.m.  9. Macular degeneration.  Continue Ocuvite.  Has appointment to follow up on     Friday.  10.      Depression.  Continue Zoloft.  11.      Osteoporosis.  On Actonel as an outpatient, but not calcium or     vitamin D.  We will consider starting as an outpatient.  12.      Prophylaxis.  Place on proton pump inhibitor, PAS hose.  We will     hold on anticoagulation with history of gastrointestinal bleed and     current heme positive stools with a decreased hemoglobin versus three     months ago.  Ace Gins, MD    JS/MEDQ  D:  04/22/2004  T:  04/22/2004  Job:  [id number]   cc:   Olena Leatherwood [facility]  [name], M.D.  [address]  [place]  [place] [address]  Fax: [phone]   Dr. Madilyn Fireman

## 2011-03-05 NOTE — Discharge Summary (Signed)
[name], [name]                           ACCOUNT NO.:  [id number]   MEDICAL RECORD NO.:  [id number]                   PATIENT TYPE:  INP   LOCATION:  5151                                 FACILITY:  [facility]   PHYSICIAN:  Leighton Roach McDiarmid, M.D.             DATE OF BIRTH:  1917-10-15   DATE OF ADMISSION:  12/27/2003  DATE OF DISCHARGE:  2020-05-504                                 DISCHARGE SUMMARY   DISCHARGE DIAGNOSES:  1. Community-acquired pneumonia.  2. Asthma, rule out chronic obstructive pulmonary disease.   DISCHARGE MEDICATIONS:  1. Avelox 400 mg p.o. daily for 11 more days.  2. Advair 250/50 Diskus 1 puff b.i.d.  3. Prednisone 40 mg p.o. daily for 2 more days.  4. Avapro 300 mg p.o. daily.  5. Zoloft 50 mg p.o. daily.  6. Synthroid 100 mcg p.o. daily.  7. Albuterol MDI 2 puffs p.r.n.   DISCHARGE INSTRUCTIONS:  1. Diet, Ensure t.i.d.  2. Activity as tolerated.  3. Follow up with Dr. Hal Hope on March 18 at 11:40 a.m.   DISPOSITION:  The patient was discharged to home.   REVIEW OF HISTORY:  This is an 76 year old white female with a history of  asthma who presented to the primary M.D. with a 4-day history of cough  productive of clear sputum, nausea, decreased oral intake and fatigue.  Lab  exam showed a WBC count of 31,000, and a chest x-ray showed right lower lobe  infiltrate.  The patient had an O2 saturation of 86% on room air becoming  95% on 3 liters of oxygen with a blood pressure of 92/62.  No history of  fever.  No shortness of breath.  Patient has smoking history up to about 8  cigarettes per day.   PHYSICAL EXAMINATION:  Vital signs showed a blood pressure of 103/40, a  pulse rate of 103, respiratory rate of 28 and a temperature of 101.6.  Patient was not in apparent distress, was on O2 by nasal cannula, was alert  and coherent.  There were decreased breath sounds all throughout the lung  fields with end-expiratory wheezes most prominent at the base of  lung  fields.  No definite crackles.  The rest of the physical examination  essentially normal.   LABS ON ADMISSION:  Hemoglobin 11.5, hematocrit 34.1, WBC of 31.2, platelets  of 306, BUN of 27 and a creatinine of 2.1.   Chest x-ray done at [facility] showed a right lower lobe  infiltrate with increase __________  diameter of her chest and a flattened  diaphragm.   COURSE IN THE HOSPITAL:  1. Community-acquired pneumonia.  The patient was started on Avelox 400 mg     p.o. daily.  Blood culture showed no growth.  The patient became afebrile     and was eventually weaned off oxygen with improvement of her symptoms.  2. Asthma.  Rule out chronic  obstructive pulmonary disease.  The patient was     continued on Advair, albuterol p.r.n.  The patient was started on     prednisone 40 mg p.o. daily for a 5-day steroid burst.  3. Acute renal insufficiency.  The patient was hydrated with note of     improvement of her creatinine.  Prior to discharge, her creatinine level     was 1.3.  4. Nicotine abuse.  The patient was advised on smoking cessation.  This is     to be followed up with a primary M.D. on followup.   CONDITION ON DISCHARGE:  The patient was discharged with light shortness of  breath on ambulation.  The patient had O2 SATs of 92 to 93% on room air with  no episodes of decreased O2 sat on ambulation.  The patient was afebrile  with improved appetite.  Vital signs were stable.  On physical exam there  was good air entry with inspiratory crackles in the right lower lung fields  and some wheezes.  The patient was advised to follow up with her primary  M.D. and to complete her course of antibiotics.   PERTINENT LAB ON DISCHARGE:  Sodium 137, potassium 4.7, chloride 115, CO2  16, glucose 123, BUN 17, creatinine 1.4, calcium 7.2.  CBC showed a WBC  count of 11, hemoglobin of 10.1, hematocrit of 30.3 and a platelet of 284.  Blood cultures showed no growth.       Lawerance Sabal, MD                         Leighton Roach McDiarmid, M.D.    MC/MEDQ  D:  01/01/2004  T:  01/03/2004  Job:  [id number]   cc:   Clydie Braun L. Hal Hope, M.D.  [address] [place]  [place] [address]  Fax: [phone]

## 2011-03-05 NOTE — Assessment & Plan Note (Signed)
[facility]                             PULMONARY OFFICE NOTE   NAME:[name], [name]                      MRN:          [id number]  DATE:05/08/2007                            DOB:          05/22/1917    [name] had a repeat CT scan of the chest to follow up a left lower  lobe nodule.  It is not present on the current scan.  It does show  emphysema without dominant nodule.  At this point, no further pulmonary  followup or CT scanning is necessary.     Charlcie Cradle Delford Field, MD, [facility]  Electronically Signed    PEW/MedQ  DD: 05/08/2007  DT: 05/08/2007  Job #: [id number]   cc:   Ernestina Penna, M.D.

## 2011-03-05 NOTE — Discharge Summary (Signed)
NAMESAI, [name]                           ACCOUNT NO.:  [id number]   MEDICAL RECORD NO.:  [id number]                   PATIENT TYPE:  INP   LOCATION:  3743                                 FACILITY:  [facility]   PHYSICIAN:  Leighton Roach McDiarmid, M.D.             DATE OF BIRTH:  [birth year]   DATE OF ADMISSION:  04/21/2004  DATE OF DISCHARGE:  04/25/2004                                 DISCHARGE SUMMARY   DISCHARGE DIAGNOSES:  1. Gastrointestinal bleeding secondary to diverticulosis.  2. Cardiac strain secondary to anemia and cellulitis.  3. Right lower-extremity cellulitis.  4. Acute on chronic anemia.  5. Arthritis.  6. Macular degeneration.  7. Asthma.  8. Hypothyroidism.  9. Osteoporosis.  10.      Hypertension.   HISTORY OF PRESENT ILLNESS:  In brief, the patient is an 77 year old white  female who presented to [facility] on July 5 with severe  weakness x4 days.  The patient reported she had not been feeling well since  she cut her right leg one week ago.  However, on Saturday, the patient  became weak all over, too tired to do any of her housework.  The patient  waited until the 5th, and then went to her primary care physician.  When  seen in the office, and EKG there showed T wave changes in the lateral leads  when compared to an EKG from March of 2005.  Her primary-care physician then  referred her to [facility] emergency department for evaluation of  possible myocardial infarction.  For the rest of the history of present  illness, please see the dictated H&P in the patient's chart.   HOSPITAL COURSE:  Problem 1.  Gastrointestinal bleeding.  The patient has a  history of gastrointestinal bleeding stemming from a duodenal ulcer which  was treated with EGD and cauterization in April of 2005.  Her hemoglobin at  that hospitalization was found to be 11.2.  However, today on admission, the  patient's hemoglobin was 9.5, and the patient was found to have  heme  positive stool.  The subsequent morning after IV fluid hydration, the  patient's hemoglobin had trended downward to 8.  At that time, the patient  was transfused two units of packed red blood cells.  After the transfusion,  her hemoglobin and hematocrit bumped appropriately to 10.3/ 30.5.  Hemoglobins and hematocrits were then monitored every day for the remainder  of her hospital admission.  Her hemoglobins ranged from 10.3 to 12.1, and on  the day of discharge, it was 11.0.  Her hematocrits after transfusion ranged  from 30.5 to 35.3, and on the day of discharge, was 31.3 and stable.  Her  gastroenterologist, Dr. Madilyn Fireman, was called and consulted on July 6.  After a  long discussion with the patient, the patient denied wanting to do a  colonoscopy even though her previous colonoscopy had a  very remote history.  The patient states she has multiple friends who have undergone colonoscopies  and have had adverse outcomes.  The patient was then persuaded to pursue and  air-contrast barium enema on July 8.  The subsequent evaluation revealed  diverticulosis; however, no evidence of malignancy.  It was assumed that the  patient's GI bleeding was secondary to diverticulosis and not to an  underlying malignancy.  As the patient hemoglobin and hematocrit had  remained stable throughout her hospital course, it was felt that the bleed  had resolved.  The patient was then instructed to follow up with Dr. Madilyn Fireman  at [facility] in the future.  The patient was given the number for this as  well.   Problem 2.  Cardiac strain secondary to anemia and cellulitis.  On  admission, the patient's EKG when compared to an EKG dated January 16, 2004,  showed new T wave inversions in leads V3, V4, V5 and V6, as well as T-wave  inversions in leads 2, 3 and aVF.  There was no ST elevation.  There were no  Q waves seen on these EKG's.  A set of cardiac markers was obtained at the  time of admission.  A troponin at  that time was found to be 0.8.  After  transfusion, a repeat troponin was found to be 0.41.  The patient's cardiac  markers were seen to trend downwards after that point.  Dr. Cassell Clement, the patient's cardiologist, was consulted.  Dr. Patty Sermons  suggested that the only change in the patient's care would be to  anticoagulate her.  However, given her GI bleed, this was contraindicated.  Dr. Patty Sermons then started her on metoprolol XL, 25 mg p.o. daily, and  instructed the patient to follow up with him one week after discharge.  The  patient was continued on the metoprolol XL 25 mg p.o. daily at the time of  discharge, and was given information to contact Dr. Patty Sermons and schedule  the one-week followup.   Problem 3.  Anemia, acute on chronic.  As stated in the hospital course for  problem #1, the patient's hemoglobin on arrival was 9.5, and then trended  downward to 8.0.  The patient was transfused, and the hemoglobin and  hematocrit bumped accordingly.  Serial CBC's were then monitored every day,  and the patient's hemoglobin and hematocrit remained stable, indicating that  the acute source of the anemia had resolved.   Problem 4.  Right lower-extremity cellulitis.  The patient had cut her right  leg one week prior to admission, and had been followed at [facility] for this.  On admission, it was felt that we should cover  for any community-acquired organisms such as MRSA.  The patient was started  on doxycycline 100 mg p.o. b.i.d. for a total course of 10 days.  The  patient was discharged with seven days remaining on that course of therapy.  The patient was instructed to apply Silvadene cream to the wounds, and wrap  those wounds with gauze and Kerlix at least twice a day.  The patient was  also instructed to keep those wounds clean and dry, not to submerge the  wounds in water.  The patient was instructed to follow up with [facility] at some  point next week to evaluate proper healing of these  wounds.   Problem 5.  The other diagnoses listed in this discharge summary are chronic  medical conditions  for which the patient was treated with her standard home  medications.   DISCHARGE MEDICATIONS:  1. Doxycycline 100 mg tablets, one p.o. b.i.d. x7 days.  2. Metoprolol XL, 25 mg tablets one p.o. daily.  3. Advair inhaler, one puff b.i.d.  4. Synthroid 100 mcg one p.o. daily.  5. Protonix 40 mg, one p.o. b.i.d.  6. Ocuvite multivitamins, two tablets p.o. b.i.d. .  7. Flexeril 10 mg, one p.o. q.h.s.  8. Zoloft 50 mg, one p.o. daily.  9. Albuterol inhaler, two puffs p.r.n. as needed for shortness of breath.   FOLLOWUP:  1. The patient was instructed to followup with [facility]     next week to evaluate for wound healing in her right lower leg.  The     number is [phone], and the patient was instructed to call for that     appointment.  2. The patient was also instructed to follow up with Dr. Cassell Clement in     one week after discharge, and was given the number [phone] and told to     call and schedule that appointment for one week.  3. The patient was instructed to follow up with Dr. Dorena Cookey at [facility],     and was given the number [phone]     and instructed to call and schedule followup as their schedule permits.  4. Followup instructions:  We would appreciate it if [facility] would monitor the progress of her right lower-extremity     cellulitis and wound healing.      Broadus John T. Pamalee Leyden, MD                  Etta Grandchild, M.D.    WTP/MEDQ  D:  04/25/2004  T:  04/26/2004  Job:  [id number]   cc:   Olena Leatherwood [facility]   Cassell Clement, M.D.  [address]  [place]  [place] [address]  Fax: [phone]   Everardo All. Madilyn Fireman, M.D.  [address]  [place]  [place] [address]  Fax: [phone]

## 2011-03-05 NOTE — Discharge Summary (Signed)
   [name]                           ACCOUNT NO.:  [id number]   MEDICAL RECORD NO.:  [id number]                   PATIENT TYPE:  INP   LOCATION:  5038                                 FACILITY:  [facility]   PHYSICIAN:  [name], M.D.                 DATE OF BIRTH:  07-09-1917   DATE OF ADMISSION:  11/25/2002  DATE OF DISCHARGE:  11/30/2002                                 DISCHARGE SUMMARY   HISTORY OF PRESENT ILLNESS:  The patient is an 77 year old white female who  presented with black heme-positive stool, orthostatic dizziness and  weakness, with a hemoglobin of 7, BUN 86, and creatinine 1.0 in the  emergency room.  She had been taking Celebrex 200 mg a day and aspirin 81 mg  a day.  She was admitted for likely upper GI tract bleeding.  For details,  please see admission History and Physical.   COURSE IN HOSPITAL:  The patient underwent EGD on the day of admission which  surprisingly revealed only small antral erosions with no active bleeding and  a moderate sized hiatal hernia.  She was transfused 3 units of packed red  blood cells with resulting hemoglobin of 9.5.  She had no stools the first  two to three days of hospitalization.  Her CLOtest came back negative.  BUN  fell rapidly and was 15 on the day of discharge.  Her diet was gradually  advanced, and on February 14, she was felt ready for discharge.   DISCHARGE DIAGNOSES:  Gastrointestinal bleeding, presumably from small  intestine.  Cannot rule out lower gastrointestinal bleed.   DISCHARGE MEDICATIONS:  Same as on admission except hold aspirin until seen  by Dr. Madilyn [name] and no Celebrex for one month.  She is also discharged on  Prilosec OTC once a day.   FOLLOW UP:  Dr. Madilyn [name] in two to four weeks.                                               [name], M.D.    JCH/MEDQ  D:  02/07/2003  T:  02/08/2003  Job:  [id number]   cc:   [name], M.D.  [phone], [address]  [place], [place] [address]  Fax:  [phone]   [name], M.D.

## 2011-03-05 NOTE — H&P (Signed)
[name], [name] NO.:  [id number]   MEDICAL RECORD NO.:  [id number]                   PATIENT TYPE:  INP   LOCATION:  6522                                 FACILITY:  [facility]   PHYSICIAN:  Tasia Catchings, M.D.                DATE OF BIRTH:  April 13, 1917   DATE OF ADMISSION:  01/16/2004  DATE OF DISCHARGE:                                HISTORY & PHYSICAL   HISTORY OF PRESENT ILLNESS:  [name] is an 77 year old female admitted  with black stools and dropping hemoglobin.  She had noticed over the past  few days some nausea and mild dyspnea on exertion.  She denies diarrhea,  abdominal pain, dizziness, or chest pain.  She just left the hospital two  weeks ago for a community acquired pneumonia, and her hemoglobin at that  time was 11.  Three days ago at [facility], her hemoglobin  was 8.9, and today it is 6.6.  Despite that, she has had no history of  hematemesis, and she has had no bowel movements the last three days.  The  color of the bowel movements have been hard to determine because she has bad  macular degeneration.   She has a history of a questionable upper GI bleed one year ago, at which  time Dr. Madilyn Fireman admitted her and on endoscopy found antral gastritis.  She  has not taken NSAIDS since then, but had only been on a small dose of  aspirin as well as some Celebrex.  She has had no further workup.  She has  not been on a PPI.   ADDITIONAL MEDICAL PROBLEMS:  1. COPD with a recent pneumonia, and unfortunately she is still smoking.  2. Hypertension.  3. Osteoporosis.  4. Macular degeneration.  5. Depression.  6. Hypothyroidism.   ALLERGIES:  1. SULFA.  2. CODEINE.  3. AVELOX.   HABITS:  She smokes one pack a day and has a 50+ pack year history.  Alcohol:  None.  Caffeine:  None.   CURRENT MEDICATIONS:  1. Advair 250/50 one puff b.i.d.  2. Albuterol inhaler p.r.n.  3. Micardis 80 mg daily.  4. Actonel 35 mg  weekly.  5. Zoloft 50 mg daily.   PAST SURGICAL HISTORY:  1. Appendectomy.  2. Tonsillectomy.  3. Cholecystectomy.  4. TAH/BSO.  5. Full mouth extraction.   FAMILY HISTORY:  Father died at age 78 of lung cancer.  Mother died at age  32 of pneumonia.  She had two brothers;  one died of heart disease, one is  alive and well.  She has one sister who died in her 74s of unknown cause.  She has three children alive and well.   SOCIAL HISTORY:  Native of [place].  Retired Print production planner.  Married  for 20 years, widowed in 1963, currently lives alone.   PHYSICAL EXAMINATION:  GENERAL:  Elderly,  pale white female.  VITAL SIGNS:  Blood pressure 120/60, pulse 66.  No postural changes.  HEENT:  Macular degeneration and dentures.  NECK:  Supple without nodes, bruits, thyromegaly, and no JVD.  LUNGS:  Bilateral rhonchi and wheezes, but no rales.  HEART:  A grade 2/6 systolic ejection murmur along the left sternal border.  ABDOMEN:  Slightly obese, nontender, without organomegaly or masses  palpable, and no bruits.  Bowel sounds were normal.  RECTAL:  Hard stool which was black, but guaiac positive.  EXTREMITIES:  Pulses intact.  Some varicose veins in both legs.   LABORATORY DATA:  Hemoglobin 6.6, MCV 85, BUN 60, creatinine 1.6, CMET  otherwise okay.  Prothrombin time and PTT are pending.   IMPRESSION:  1. Upper gastrointestinal bleed.  2. Chronic obstructive pulmonary disease.  3. Depression.  4. Hypothyroidism.  5. Macular degeneration.  6. Degenerative joint disease.  7. Osteoporosis.   PLAN:  The patient is to be transfused and to undergo upper endoscopy  tomorrow.                                                Tasia Catchings, M.D.    JW/MEDQ  D:  01/16/2004  T:  01/16/2004  Job:  [id number]   cc:   Everardo All. Madilyn Fireman, M.D.  [address]  [place]  [place] [address]  Fax: [phone]   Marcos Eke. Hal Hope, M.D.  [address] [place]  [place] [address]   Fax: [phone]

## 2011-07-19 LAB — DIFFERENTIAL
Basophils Absolute: 0
Basophils Absolute: 0
Basophils Absolute: 0
Basophils Relative: 0
Basophils Relative: 0
Basophils Relative: 0
Eosinophils Absolute: 0.1
Eosinophils Relative: 2
Lymphocytes Relative: 13
Lymphocytes Relative: 22
Monocytes Absolute: 1
Monocytes Absolute: 1
Monocytes Relative: 7
Monocytes Relative: 6
Neutro Abs: 5.6
Neutrophils Relative %: 88 — ABNORMAL HIGH
Neutrophils Relative %: 68

## 2011-07-19 LAB — HEMOGLOBIN AND HEMATOCRIT, BLOOD
HCT: 30.4 — ABNORMAL LOW
Hemoglobin: 10.3 — ABNORMAL LOW

## 2011-07-19 LAB — HEPARIN LEVEL (UNFRACTIONATED)
Heparin Unfractionated: 0.35
Heparin Unfractionated: 0.42

## 2011-07-19 LAB — BASIC METABOLIC PANEL
BUN: 10
BUN: 12
BUN: 8
BUN: 9
CO2: 19
CO2: 24
CO2: 25
CO2: 25
CO2: 27
Calcium: 8.2 — ABNORMAL LOW
Calcium: 8.3 — ABNORMAL LOW
Calcium: 9.7
Chloride: 103
Chloride: 104
Chloride: 104
Chloride: 102
Chloride: 106
Creatinine, Ser: 1.11
Creatinine, Ser: 1.19
Creatinine, Ser: 1.28 — ABNORMAL HIGH
Creatinine, Ser: 1.32 — ABNORMAL HIGH
Creatinine, Ser: 1.38 — ABNORMAL HIGH
GFR calc Af Amer: 45 — ABNORMAL LOW
GFR calc Af Amer: 45 — ABNORMAL LOW
GFR calc Af Amer: 44 — ABNORMAL LOW
GFR calc non Af Amer: 36 — ABNORMAL LOW
GFR calc non Af Amer: 38 — ABNORMAL LOW
GFR calc non Af Amer: 46 — ABNORMAL LOW
Glucose, Bld: 92
Glucose, Bld: 99
Glucose, Bld: 88
Glucose, Bld: 94
Potassium: 4
Potassium: 4
Potassium: 4.2
Potassium: 4.2
Potassium: 4.4
Sodium: 133 — ABNORMAL LOW
Sodium: 137

## 2011-07-19 LAB — CBC
HCT: 34.4 — ABNORMAL LOW
HCT: 42.1
Hemoglobin: 11.7 — ABNORMAL LOW
Hemoglobin: 14.1
MCHC: 33.7
MCHC: 33.9
MCHC: 33.2
MCHC: 33.8
MCV: 89.2
MCV: 88.4
Platelets: 165
Platelets: 294
RBC: 4.08
RBC: 4.34
RDW: 12.6
RDW: 12.6
RDW: 12.9
RDW: 12.8
WBC: 15.4 — ABNORMAL HIGH
WBC: 7.6
WBC: 8.3

## 2011-07-19 LAB — URINALYSIS, ROUTINE W REFLEX MICROSCOPIC
Glucose, UA: NEGATIVE
Glucose, UA: NEGATIVE
Hgb urine dipstick: NEGATIVE
Hgb urine dipstick: NEGATIVE
Ketones, ur: 15 — AB
Nitrite: NEGATIVE
Protein, ur: NEGATIVE
Specific Gravity, Urine: 1.015
Specific Gravity, Urine: 1.02
Urobilinogen, UA: 1
pH: 6
pH: 6.5

## 2011-07-19 LAB — CULTURE, BLOOD (ROUTINE X 2)
Culture: NO GROWTH
Culture: NO GROWTH

## 2011-07-19 LAB — COMPREHENSIVE METABOLIC PANEL
ALT: 14
Albumin: 3.6
Alkaline Phosphatase: 57
BUN: 15
Chloride: 102
Glucose, Bld: 128 — ABNORMAL HIGH
Potassium: 4.2
Sodium: 137
Total Bilirubin: 1.1
Total Protein: 6.9

## 2011-07-19 LAB — CK TOTAL AND CKMB (NOT AT ARMC)
CK, MB: 2.9
Relative Index: INVALID
Relative Index: INVALID
Total CK: 73

## 2011-07-19 LAB — URINE CULTURE: Colony Count: 50000

## 2011-07-19 LAB — POCT CARDIAC MARKERS: Myoglobin, poc: 292

## 2011-07-19 LAB — SEDIMENTATION RATE: Sed Rate: 41 — ABNORMAL HIGH

## 2011-07-19 LAB — APTT
aPTT: 42 — ABNORMAL HIGH
aPTT: 35

## 2011-07-19 LAB — PROTIME-INR
INR: 1.1
INR: 1
Prothrombin Time: 14.9

## 2011-07-19 LAB — CARDIAC PANEL(CRET KIN+CKTOT+MB+TROPI)
CK, MB: 2.2
Relative Index: INVALID
Total CK: 65

## 2011-07-19 LAB — RPR: RPR Ser Ql: NONREACTIVE

## 2011-07-19 LAB — TROPONIN I: Troponin I: 0.03

## 2011-09-17 IMAGING — CR DG CHEST 2V
3 series · 3 of 3 positions shown · non-contrast
Comparison: 09/15/2010.  CT 07/15/2008.

CLINICAL DATA: History of syncope.  Follow up pneumonia.

CHEST - 2 VIEW

[w chest lat (1 of 2)]
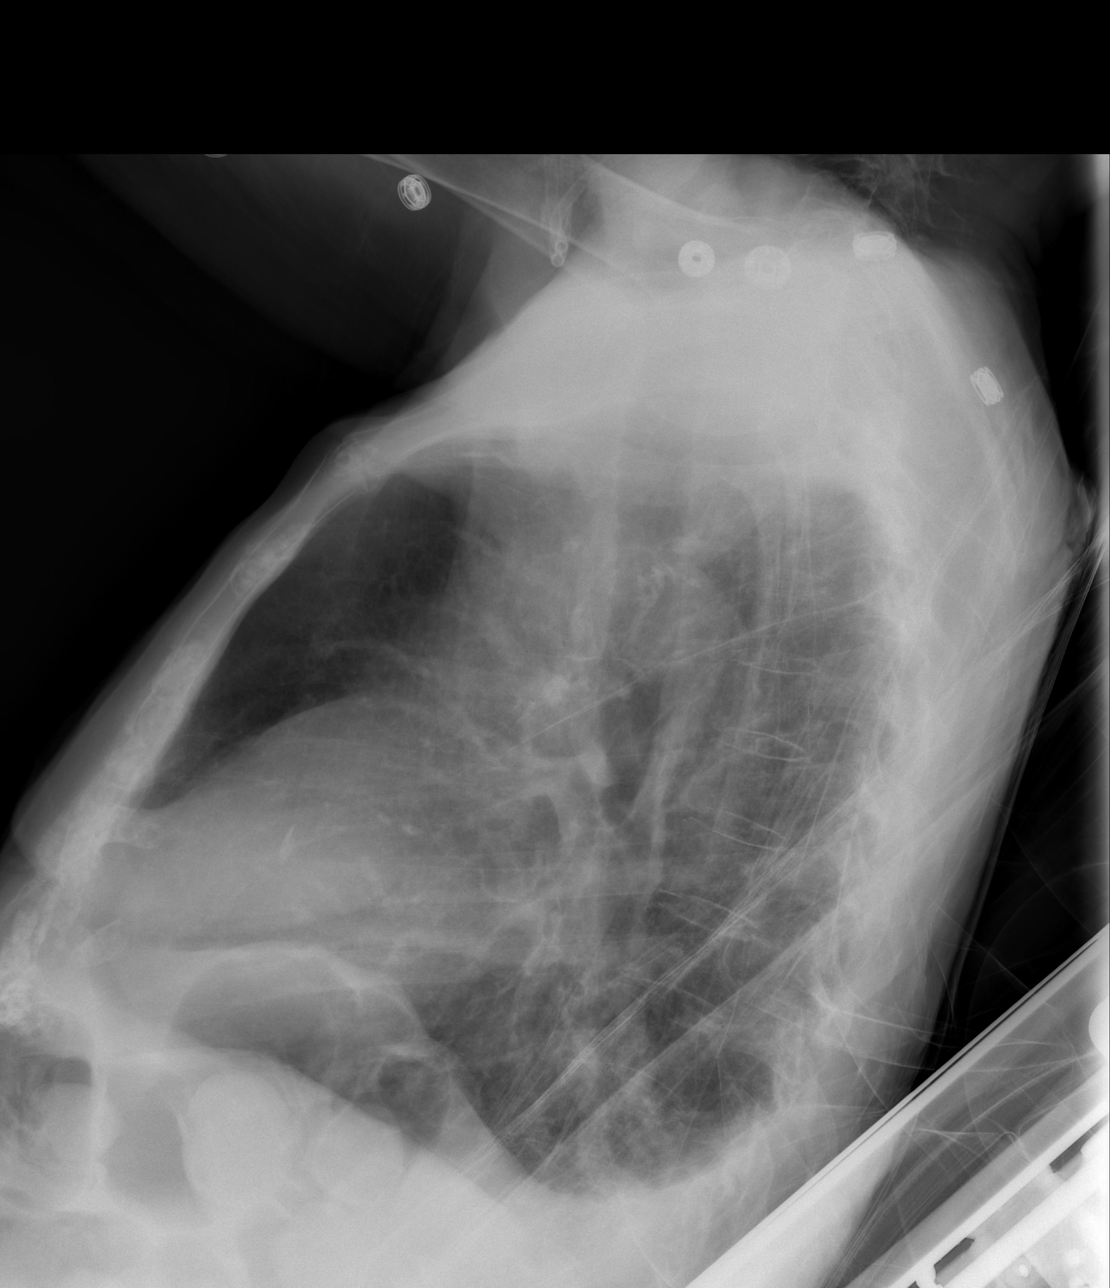

[w chest lat (2 of 2)]
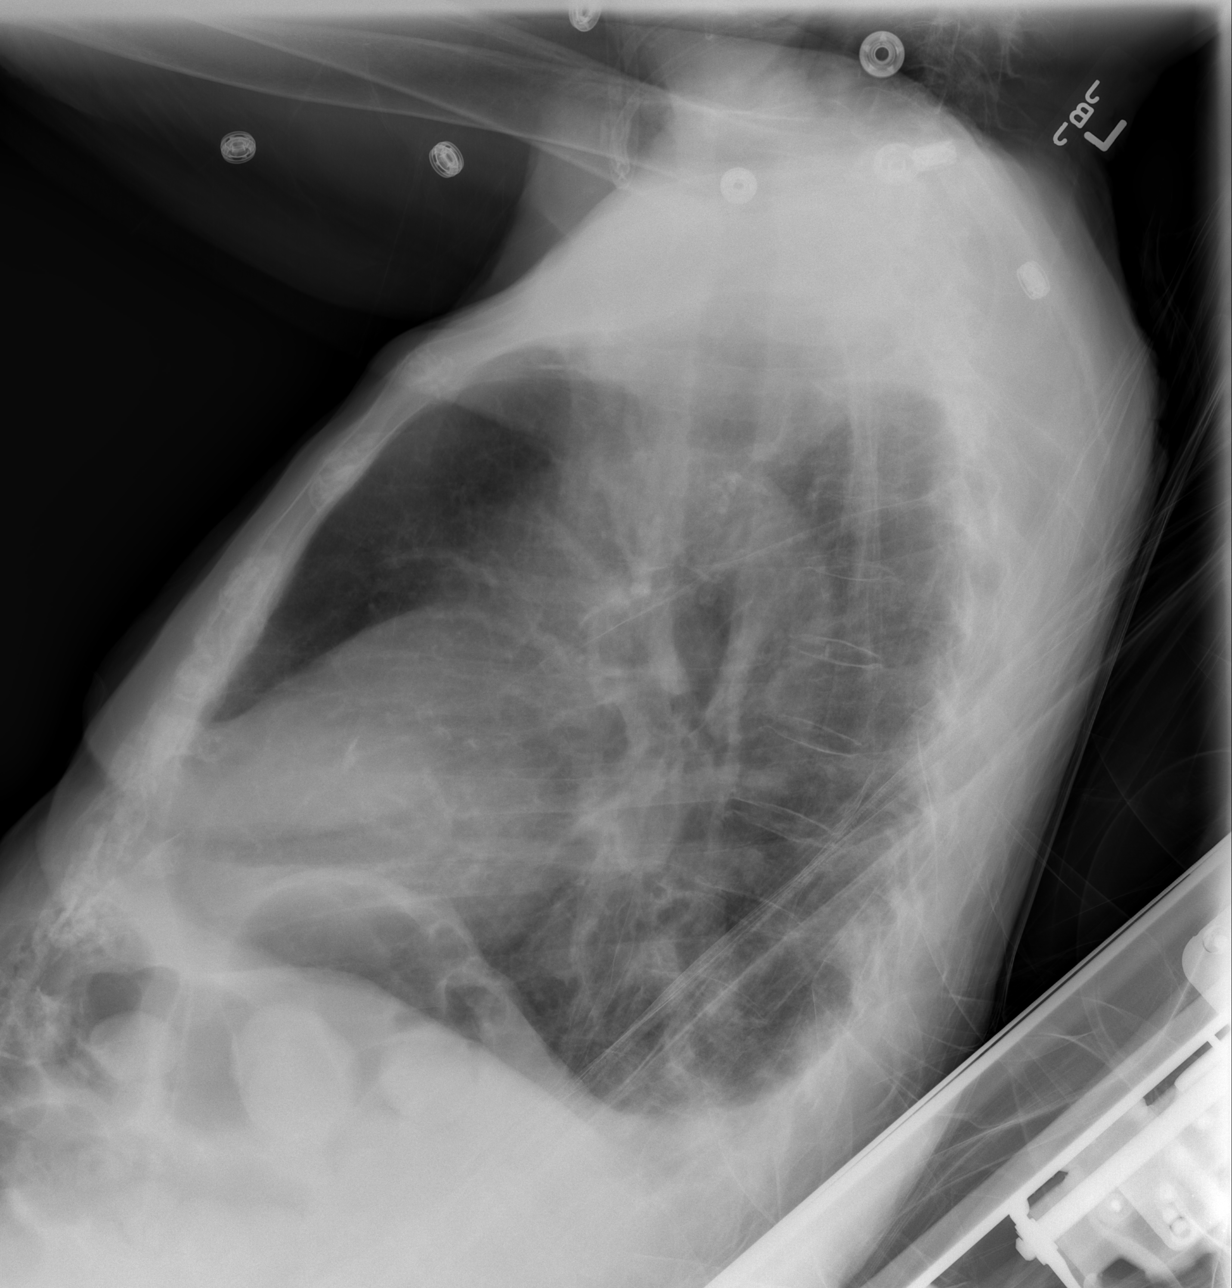

[view not recorded]
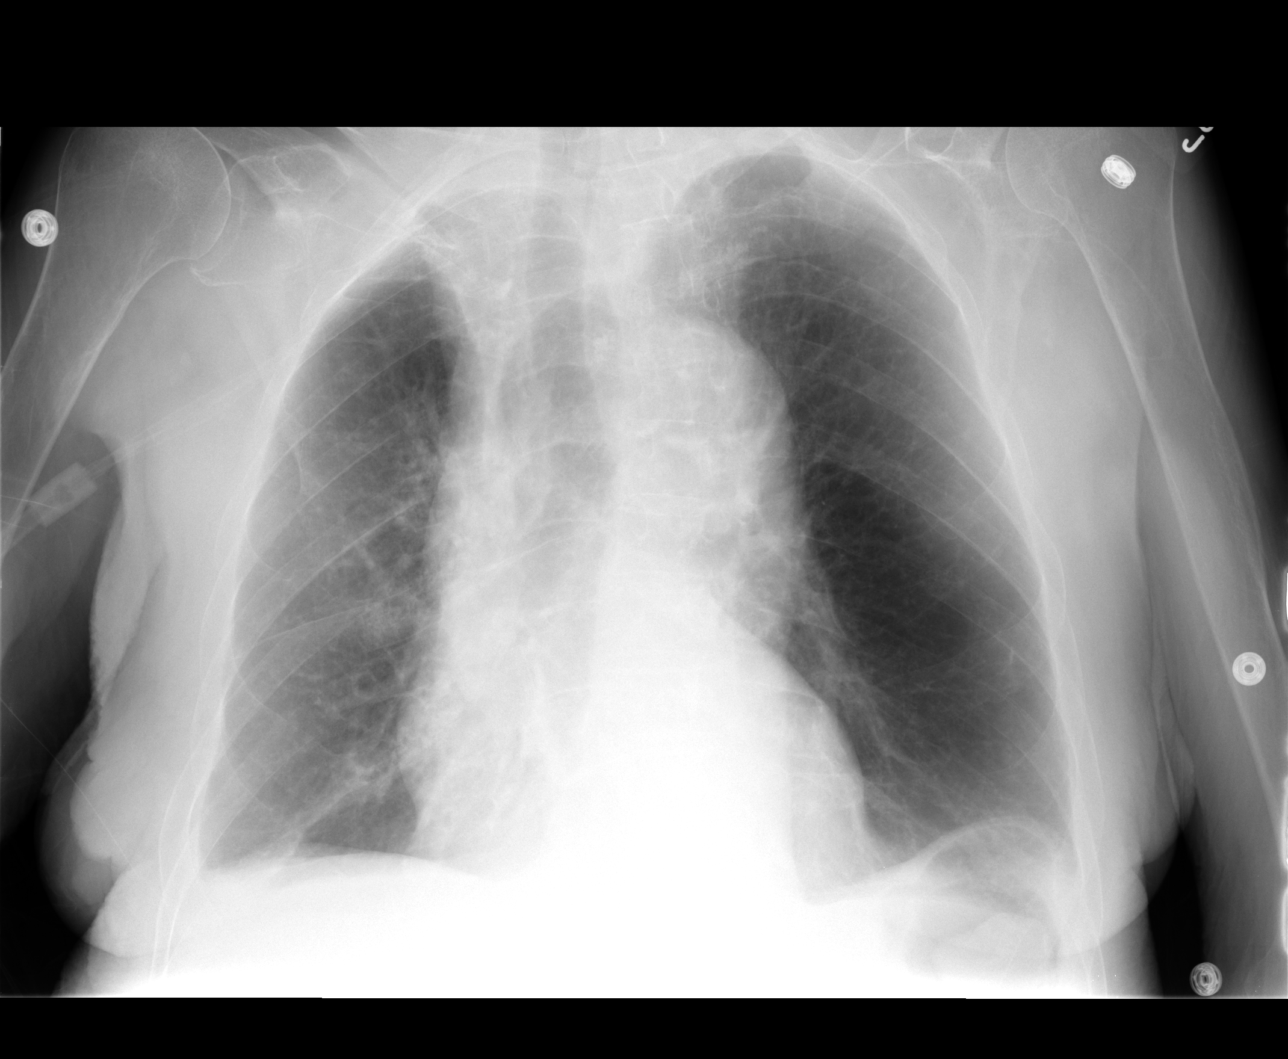

[3 of 3 positions shown; findings below may reference images not displayed]

FINDINGS: .The cardiac silhouette is minimally enlarged.The
ectasia and prominence of the thoracic aorta is stable.  There is
generalized hyperinflation configuration.  There is posterior
costophrenic angle blunting consistent with small amounts of
pleural effusion or thickening.  There is minimal atelectasis in
the right base with minimal patchy infiltrative density which has
improved since prior examination.  Osteopenic appearance of bones.
IMPRESSION: Minimal enlargement of the cardiac silhouette is stable.  Ectasia
and prominence of thoracic aorta is stable.  Generalized
hyperinflation configuration.  Posterior basilar atelectasis and
infiltrative density associated with small pleural effusions.
Minimal atelectasis in the right base on PA examination with
minimal patchy right basilar infiltrative density which has
improved since prior examination.

## 2011-09-21 IMAGING — CR DG CHEST 1V PORT
1 series · 1 of 1 positions shown · non-contrast
Comparison: Chest x-ray 09/17/2010

CLINICAL DATA: Shortness of breath.

PORTABLE CHEST - 1 VIEW

[view not recorded]
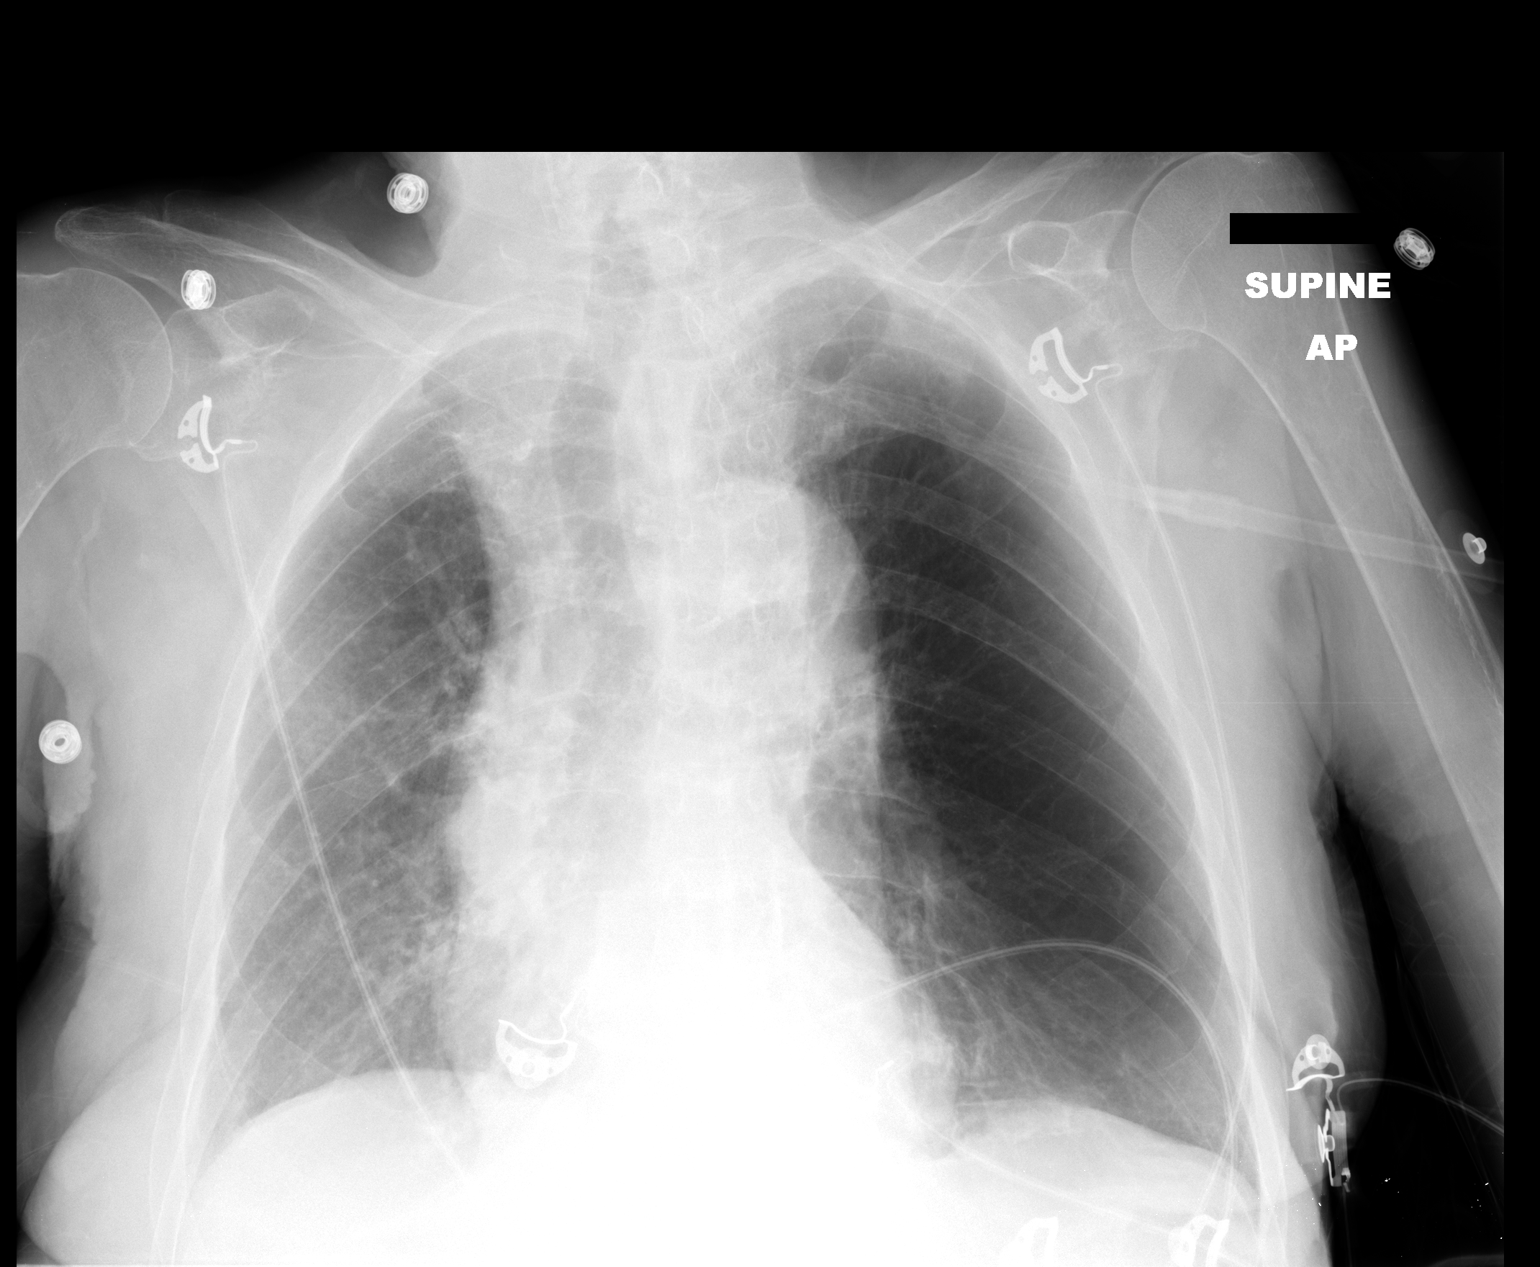

[1 of 1 positions shown; findings below may reference images not displayed]

FINDINGS: The cardiac silhouette, mediastinal and hilar contours
are stable.  There is tortuosity and ectasia of the thoracic aorta.
There are chronic lung changes/COPD without definite acute
overlying pulmonary process.  The bony thorax is intact.
IMPRESSION: Chronic lung changes/COPD without definite acute overlying
pulmonary process.

## 2012-01-06 ENCOUNTER — Encounter: Payer: Self-pay | Admitting: Internal Medicine

## 2012-01-07 ENCOUNTER — Ambulatory Visit (INDEPENDENT_AMBULATORY_CARE_PROVIDER_SITE_OTHER): Payer: Medicare Other | Admitting: Internal Medicine

## 2012-01-07 ENCOUNTER — Encounter: Payer: Self-pay | Admitting: Internal Medicine

## 2012-01-07 ENCOUNTER — Institutional Professional Consult (permissible substitution): Payer: Self-pay | Admitting: Internal Medicine

## 2012-01-07 VITALS — BP 98/48 | HR 79 | Ht 65.0 in | Wt 123.2 lb

## 2012-01-07 DIAGNOSIS — J189 Pneumonia, unspecified organism: Secondary | ICD-10-CM

## 2012-01-07 DIAGNOSIS — J449 Chronic obstructive pulmonary disease, unspecified: Secondary | ICD-10-CM

## 2012-01-07 MED ORDER — BENZONATATE 200 MG PO CAPS: 200.0000 mg | ORAL_CAPSULE | Freq: Three times a day (TID) | ORAL | Status: AC | PRN

## 2012-01-07 NOTE — Patient Instructions (Signed)
Elevate head of be 30 degrees for sleep- this helps prevent reflux  Script for benzonatate pearls to try for cough- show this to the nurses  Stop Advair. If you begin noticing more shortness of breath, chest tightness or wheeze, be sure to let the nurses know so they can discuss with Dr Chilton Si

## 2012-01-07 NOTE — Progress Notes (Signed)
[birth year]- 89 yoF 40-pack-year smoker seen on kind referral by Dr Murray Hodgkins for pulmonary evaluation because of recurrent pneumonias. A daughter is here (on portable O2). Patient is in wheelchair. They describe recurrent pneumonias over several years. She has had pneumonia and flu vaccines. No recognized reflux. Short of breath with exertion but does little walking. Recent tussive left lateral lower rib pain is now fading. No sinus disease, recent fever or discolored sputum. Chest x-ray: Report date 12/20/2011-minimal cardiomegaly with minimal pulmonary vascular congestion, appearing new area new small right pleural effusion. Patchy pneumonitis right mid and lower lung. Chest x-ray report 01/04/2012: No definite left rib fractures. Patchy bilateral lower lobe interstitial changes with blunting of both costophrenic angles consistent with small pleural effusions. Antibiotic started 12/21/2011-Tequin and Rocephin. WBC 04-25-2012: 12,100. BUN 34, creatinine 1.37. Today she feels "very well".  Past medical history of asthma, emphysema. Surgical history of hysterectomy. Remote limited exposure to tuberculosis, few details. Family history: Emphysema and clotting disorder. Daughter is here, on portable oxygen, admits she also had long smoking history. Social history: Widowed. Lives at [facility]  ROS-see HPI Constitutional:   No-   weight loss, night sweats, fevers, chills, fatigue, lassitude. HEENT:   No-  headaches, difficulty swallowing, tooth/dental problems, sore throat,       No-  sneezing, itching, ear ache, nasal congestion, post nasal drip,  CV:  No-  anginal chest pain, orthopnea, PND, swelling in lower extremities, anasarca, dizziness, palpitations Resp: +  shortness of breath with exertion or at rest.              No-   productive cough,  No non-productive cough,  No- coughing up of blood.              No-   change in color of mucus.  No- wheezing.   Skin: No-   rash or lesions. GI:  No-    heartburn, indigestion, abdominal pain, nausea, vomiting, GU: . MS:  No-   joint pain or swelling.  No- decreased range of motion.  No- back pain. Neuro-     nothing unusual Psych:  No- change in mood or affect. No depression or anxiety.  No memory loss.  OBJ- Physical Exam General- Alert, Oriented, Affect-appropriate, Distress- none acute; weak, elderly woman, wheelchair Skin- rash-none, lesions- none, excoriation- none Lymphadenopathy- none Head- atraumatic            Eyes- Gross vision intact, PERRLA, conjunctivae and secretions clear            Ears- Hearing, canals-normal            Nose- Clear, no-Septal dev, mucus, polyps, erosion, perforation             Throat- Mallampati II , mucosa clear , drainage- none, tonsils- atrophic Neck- flexible , trachea midline, no stridor , thyroid nl, carotid no bruit Chest - symmetrical excursion , unlabored           Heart/CV- RRR , no murmur , no gallop  , no rub, nl s1 s2                           - JVD- none , edema- none, stasis changes- none, varices- none           Lung- distant sounds , no wheeze or cough , dullness-none, rub- none           Chest wall- Pain patch on back Abd-  tender-no, distended-no, bowel sounds-present, HSM- no Br/ Gen/ Rectal- Not done, not indicated Extrem- cyanosis- none, clubbing, none, atrophy- none, strength- nl Neuro- grossly intact to observation

## 2012-01-10 DIAGNOSIS — J449 Chronic obstructive pulmonary disease, unspecified: Secondary | ICD-10-CM | POA: Insufficient documentation

## 2012-01-10 DIAGNOSIS — J189 Pneumonia, unspecified organism: Secondary | ICD-10-CM | POA: Insufficient documentation

## 2012-01-10 NOTE — Assessment & Plan Note (Signed)
Presumptive COPD after 40 pack year smoking history. Ambulation is limited at least is much by weakness and deconditioning as it is by her lung disease. She does not admit routine cough or wheeze, making asthmatic bronchitis unlikely. This is probably pure emphysema.

## 2012-01-10 NOTE — Assessment & Plan Note (Addendum)
The most common explanation would be recurrent aspiration events. She doesn't recognize this, which does not rule it out. Mild immuno-incompetence related to advanced age is another probable factor. Plan-stop Advair. The steroid component is associated with a statistical increased frequency of pneumonias because of local immune suppression in the airway. If needed, could try Serevent. She does not have asthma so a single agent long acting beta adrenergic drug can be appropriate. Reflux precautions were reviewed.

## 2013-01-12 ENCOUNTER — Non-Acute Institutional Stay (SKILLED_NURSING_FACILITY): Payer: Medicare Other | Admitting: Internal Medicine

## 2013-01-12 DIAGNOSIS — E039 Hypothyroidism, unspecified: Secondary | ICD-10-CM

## 2013-01-12 DIAGNOSIS — G4700 Insomnia, unspecified: Secondary | ICD-10-CM

## 2013-01-12 DIAGNOSIS — J449 Chronic obstructive pulmonary disease, unspecified: Secondary | ICD-10-CM

## 2013-01-12 DIAGNOSIS — K279 Peptic ulcer, site unspecified, unspecified as acute or chronic, without hemorrhage or perforation: Secondary | ICD-10-CM

## 2013-01-13 NOTE — Progress Notes (Signed)
PROGRESS NOTE  DATE: 01/12/13  FACILITY: [facility]  LEVEL OF CARE: SNF  Routine Visit  CHIEF COMPLAINT:  Manage hypothyroidism and COPD  HISTORY OF PRESENT ILLNESS:  REASSESSMENT OF ONGOING PROBLEMS:  1. HYPOTHYROIDISM: The hypothyroidism remains stable. No complications noted from the medications presently being used.  The patient denies fatigue or constipation.  Last TSH 0.532 in 12/13.  2. COPD: the COPD remains stable.  Pt denies sob, cough, wheezing or declining exercise tolerance.  No complications from the medications presently being used.  PAST MEDICAL HISTORY : Reviewed.  No changes.  CURRENT MEDICATIONS: Reviewed per [facility]  REVIEW OF SYSTEMS:  GENERAL: no change in appetite, no fatigue, no weight changes, no fever, chills or weakness RESPIRATORY: no cough, SOB, DOE,, wheezing, hemoptysis CARDIAC: no chest pain, or palpitations.  Lower extremity swelling present GI: no abdominal pain, diarrhea, constipation, heart burn, nausea or vomiting  PHYSICAL EXAMINATION  VS:  T 97.9       P 62      RR 19     BP 104/53     POX %     WT (Lb)  GENERAL: no acute distress, normal body habitus NECK: supple, trachea midline, no neck masses, no thyroid tenderness, no thyromegaly RESPIRATORY: breathing is even & unlabored, BS CTAB CARDIAC: RRR, no murmur,no extra heart sounds, +1 bilateral edema GI: abdomen soft, normal BS, no masses, no tenderness, no hepatomegaly, no splenomegaly PSYCHIATRIC: the patient is alert & oriented to person, affect & behavior appropriate  LABS/RADIOLOGY:  3/14 CBC normal Toe/13 creatinine 1.17, total protein 5, albumin 3.1 otherwise CMP normal  ASSESSMENT/PLAN:  1. hypothyroidism-well controlled. 2. COPD-well compensated. 3. peptic ulcer disease-stable. 4. insomnia-denies symptoms. 5. depression-Remeron was increased. 6. constipation-well-controlled.  CPT CODE: 96045

## 2013-01-30 ENCOUNTER — Other Ambulatory Visit: Payer: Self-pay | Admitting: Geriatric Medicine

## 2013-01-30 MED ORDER — ZOLPIDEM TARTRATE 5 MG PO TABS: 5.0000 mg | ORAL_TABLET | Freq: Every evening | ORAL | Status: DC | PRN

## 2013-03-20 ENCOUNTER — Other Ambulatory Visit: Payer: Self-pay | Admitting: *Deleted

## 2013-03-20 MED ORDER — OXYCODONE HCL 5 MG PO TABS: ORAL_TABLET | ORAL | Status: DC

## 2013-04-06 ENCOUNTER — Non-Acute Institutional Stay (SKILLED_NURSING_FACILITY): Payer: Medicare Other | Admitting: Internal Medicine

## 2013-04-06 DIAGNOSIS — J449 Chronic obstructive pulmonary disease, unspecified: Secondary | ICD-10-CM

## 2013-04-06 DIAGNOSIS — K279 Peptic ulcer, site unspecified, unspecified as acute or chronic, without hemorrhage or perforation: Secondary | ICD-10-CM

## 2013-04-06 DIAGNOSIS — G4700 Insomnia, unspecified: Secondary | ICD-10-CM

## 2013-04-06 DIAGNOSIS — E039 Hypothyroidism, unspecified: Secondary | ICD-10-CM

## 2013-04-13 DIAGNOSIS — E039 Hypothyroidism, unspecified: Secondary | ICD-10-CM | POA: Insufficient documentation

## 2013-04-13 DIAGNOSIS — K279 Peptic ulcer, site unspecified, unspecified as acute or chronic, without hemorrhage or perforation: Secondary | ICD-10-CM | POA: Insufficient documentation

## 2013-04-13 DIAGNOSIS — G4700 Insomnia, unspecified: Secondary | ICD-10-CM | POA: Insufficient documentation

## 2013-04-13 NOTE — Progress Notes (Addendum)
PROGRESS NOTE  DATE: 04/06/13  FACILITY: [facility]  LEVEL OF CARE: SNF  Routine Visit  CHIEF COMPLAINT:  Manage hypothyroidism and COPD  HISTORY OF PRESENT ILLNESS:  REASSESSMENT OF ONGOING PROBLEMS:  1. HYPOTHYROIDISM: The hypothyroidism remains stable. No complications noted from the medications presently being used.  The patient denies fatigue or constipation.  Last TSH 0.532 in 12/13.  2. COPD: the COPD remains stable.  Pt denies sob, cough, wheezing or declining exercise tolerance.  No complications from the medications presently being used.  PAST MEDICAL HISTORY : Reviewed.  No changes.  CURRENT MEDICATIONS: Reviewed per [facility]  REVIEW OF SYSTEMS:  GENERAL: no change in appetite, no fatigue, no weight changes, no fever, chills or weakness RESPIRATORY: no cough, SOB, DOE,, wheezing, hemoptysis CARDIAC: no chest pain, or palpitations.  Lower extremity swelling present GI: no abdominal pain, diarrhea, constipation, heart burn, nausea or vomiting  PHYSICAL EXAMINATION  VS:  T 97.2       P 86      RR 18     BP 91/52     POX %     WT (Lb)  GENERAL: no acute distress, normal body habitus NECK: supple, trachea midline, no neck masses, no thyroid tenderness, no thyromegaly RESPIRATORY: breathing is even & unlabored, BS CTAB CARDIAC: RRR, no murmur,no extra heart sounds, +1 bilateral edema GI: abdomen soft, normal BS, no masses, no tenderness, no hepatomegaly, no splenomegaly PSYCHIATRIC: the patient is alert & oriented to person, affect & behavior appropriate  LABS/RADIOLOGY:  3/14 CBC normal 12/13 creatinine 1.17, total protein 5, albumin 3.1 otherwise CMP normal  ASSESSMENT/PLAN:  1. hypothyroidism-well controlled.  Check TSH. 2. COPD-well compensated. 3. peptic ulcer disease-stable. 4. insomnia-denies symptoms. 5. depression-denies ongoing symptoms. 6. constipation-well-controlled. 7. check CMP.  CPT CODE: 16109

## 2013-04-17 ENCOUNTER — Other Ambulatory Visit: Payer: Self-pay | Admitting: *Deleted

## 2013-04-17 MED ORDER — OXYCODONE HCL 5 MG PO TABS: ORAL_TABLET | ORAL | Status: DC

## 2013-05-04 ENCOUNTER — Non-Acute Institutional Stay (SKILLED_NURSING_FACILITY): Payer: Medicare Other | Admitting: Internal Medicine

## 2013-05-04 DIAGNOSIS — E039 Hypothyroidism, unspecified: Secondary | ICD-10-CM

## 2013-05-04 DIAGNOSIS — M25519 Pain in unspecified shoulder: Secondary | ICD-10-CM

## 2013-05-04 DIAGNOSIS — J449 Chronic obstructive pulmonary disease, unspecified: Secondary | ICD-10-CM

## 2013-05-04 DIAGNOSIS — M25512 Pain in left shoulder: Secondary | ICD-10-CM

## 2013-05-04 DIAGNOSIS — K279 Peptic ulcer, site unspecified, unspecified as acute or chronic, without hemorrhage or perforation: Secondary | ICD-10-CM

## 2013-05-09 ENCOUNTER — Other Ambulatory Visit: Payer: Self-pay | Admitting: Internal Medicine

## 2013-05-09 DIAGNOSIS — M25512 Pain in left shoulder: Secondary | ICD-10-CM

## 2013-05-11 DIAGNOSIS — M25519 Pain in unspecified shoulder: Secondary | ICD-10-CM | POA: Insufficient documentation

## 2013-05-11 NOTE — Progress Notes (Signed)
PROGRESS NOTE  DATE: 05/04/13  FACILITY: [facility]  LEVEL OF CARE: SNF  Routine Visit  CHIEF COMPLAINT:  Manage left shoulder pain, hypothyroidism and COPD  HISTORY OF PRESENT ILLNESS:  REASSESSMENT OF ONGOING PROBLEMS:  LEFT SHOULDER PAIN: Patient is complaining of ongoing left shoulder pain for some time. She is having difficulty raising her upper extremity. She denies weakness or paresthesia.  HYPOTHYROIDISM: The hypothyroidism remains stable. No complications noted from the medications presently being used.  The patient denies fatigue or constipation.  Last TSH 0.532 in 12/13, in 6/14 TSH is 0.366.  COPD: the COPD remains stable.  Pt denies sob, cough, wheezing or declining exercise tolerance.  No complications from the medications presently being used.  PAST MEDICAL HISTORY : Reviewed.  No changes.  CURRENT MEDICATIONS: Reviewed per [facility]  REVIEW OF SYSTEMS:  GENERAL: no change in appetite, no fatigue, no weight changes, no fever, chills or weakness RESPIRATORY: no cough, SOB, DOE,, wheezing, hemoptysis CARDIAC: no chest pain, or palpitations.  Lower extremity swelling present GI: no abdominal pain, diarrhea, constipation, heart burn, nausea or vomiting  PHYSICAL EXAMINATION  VS:  Not recorded  GENERAL: no acute distress, normal body habitus EYES: Normal sclerae, normal conjunctivae, no discharge NECK: supple, trachea midline, no neck masses, no thyroid tenderness, no thyromegaly LYMPHATICS: No cervical lymphadenopathy, no supraclavicular lymphadenopathy RESPIRATORY: breathing is even & unlabored, BS CTAB CARDIAC: RRR, no murmur,no extra heart sounds, +1 bilateral edema GI: abdomen soft, normal BS, no masses, no tenderness, no hepatomegaly, no splenomegaly PSYCHIATRIC: the patient is alert & oriented to person, affect & behavior appropriate MUSCULOSKELETAL: Left shoulder tender to palpation, range of motion moderate, no left upper extremity  weakness  LABS/RADIOLOGY:  6/14 hemoglobin 10, MCV 84.4 otherwise CBC normal, creatinine 1.48 otherwise BMP normal, albumin 3.1, total protein 5.9 otherwise liver profile normal  3/14 CBC normal 12/13 creatinine 1.17, total protein 5, albumin 3.1 otherwise CMP normal  ASSESSMENT/PLAN:  hypothyroidism-well controlled.   COPD-well compensated. Left shoulder pain-uncontrolled problem. I suspect rotator cuff syndrome. Obtain left shoulder x-ray and MRI if no contraindications per radiology. peptic ulcer disease-stable. insomnia-denies symptoms. depression-denies ongoing symptoms. constipation-well-controlled.   CPT CODE: 16109

## 2013-05-14 ENCOUNTER — Ambulatory Visit (HOSPITAL_COMMUNITY): Admission: RE | Admit: 2013-05-14 | Payer: Medicare Other | Source: Ambulatory Visit

## 2013-05-22 ENCOUNTER — Ambulatory Visit (HOSPITAL_COMMUNITY)
Admission: RE | Admit: 2013-05-22 | Discharge: 2013-05-22 | Disposition: A | Discharge status: Home / Self Care | Payer: Medicare Other | Source: Ambulatory Visit | Attending: Internal Medicine | Admitting: Internal Medicine

## 2013-05-22 ENCOUNTER — Other Ambulatory Visit: Payer: Self-pay | Admitting: Internal Medicine

## 2013-05-22 DIAGNOSIS — M75100 Unspecified rotator cuff tear or rupture of unspecified shoulder, not specified as traumatic: Secondary | ICD-10-CM | POA: Insufficient documentation

## 2013-05-22 DIAGNOSIS — M25512 Pain in left shoulder: Secondary | ICD-10-CM

## 2013-05-22 DIAGNOSIS — IMO0002 Reserved for concepts with insufficient information to code with codable children: Secondary | ICD-10-CM | POA: Insufficient documentation

## 2013-05-22 DIAGNOSIS — M25419 Effusion, unspecified shoulder: Secondary | ICD-10-CM | POA: Insufficient documentation

## 2013-05-22 DIAGNOSIS — M24019 Loose body in unspecified shoulder: Secondary | ICD-10-CM | POA: Insufficient documentation

## 2013-06-15 ENCOUNTER — Non-Acute Institutional Stay (SKILLED_NURSING_FACILITY): Payer: Medicare Other | Admitting: Adult Health

## 2013-06-15 ENCOUNTER — Encounter: Payer: Self-pay | Admitting: Adult Health

## 2013-06-15 DIAGNOSIS — G4700 Insomnia, unspecified: Secondary | ICD-10-CM

## 2013-06-15 DIAGNOSIS — J4489 Other specified chronic obstructive pulmonary disease: Secondary | ICD-10-CM

## 2013-06-15 DIAGNOSIS — J449 Chronic obstructive pulmonary disease, unspecified: Secondary | ICD-10-CM

## 2013-06-15 DIAGNOSIS — E039 Hypothyroidism, unspecified: Secondary | ICD-10-CM

## 2013-06-15 DIAGNOSIS — K279 Peptic ulcer, site unspecified, unspecified as acute or chronic, without hemorrhage or perforation: Secondary | ICD-10-CM

## 2013-06-15 DIAGNOSIS — F329 Major depressive disorder, single episode, unspecified: Secondary | ICD-10-CM

## 2013-06-15 DIAGNOSIS — F3289 Other specified depressive episodes: Secondary | ICD-10-CM

## 2013-06-15 NOTE — Progress Notes (Signed)
Patient ID: [name], female   DOB: [birth year], 79 y.o.   MRN: [id number]       PROGRESS NOTE  DATE: 06/15/2013  FACILITY: Nursing Home Location: [facility]  LEVEL OF CARE: SNF (31)  Routine Visit  CHIEF COMPLAINT:  Manage depression, peptic ulcer disease, COPD and constipation  HISTORY OF PRESENT ILLNESS:  REASSESSMENT OF ONGOING PROBLEM(S):  COPD: the COPD remains stable.  Pt denies sob, cough, wheezing or declining exercise tolerance.  No complications from the medications presently being used.  HYPOTHYROIDISM: The hypothyroidism remains stable. No complications noted from the medications presently being used.  The patient denies fatigue or constipation.  6/14 TSH 0.366  INSOMNIA: The insomnia remains stable.  No complications noted from the medications presently being used. Patient denies ongoing insomnia, pain, hallucinations, delusions.  PAST MEDICAL HISTORY : Reviewed.  No changes.  CURRENT MEDICATIONS: Reviewed per [facility]  REVIEW OF SYSTEMS:  GENERAL: no change in appetite, no fatigue, no weight changes, no fever, chills or weakness RESPIRATORY: no cough, SOB, DOE, wheezing, hemoptysis CARDIAC: no chest pain, or palpitations, + edema GI: no abdominal pain, diarrhea, constipation, heart burn, nausea or vomiting  PHYSICAL EXAMINATION  VS:  T 96.7       72 P    RR 16      BP 104/59     POX 99 % with O2 at 2 L/minute via [place]    WT 114.2(Lb)  GENERAL: no acute distress, normal body habitus NECK: supple, trachea midline, no neck masses, no thyroid tenderness, no thyromegaly LYMPHATICS: no LAN in the neck, no supraclavicular LAN RESPIRATORY: breathing is even & unlabored, BS CTAB CARDIAC: RRR, no murmur,no extra heart sounds, BLE edema, 1+ GI: abdomen soft, normal BS, no masses, no tenderness, no hepatomegaly, no splenomegaly PSYCHIATRIC: the patient is alert & oriented to person, affect & behavior appropriate  LABS/RADIOLOGY: 7/14 WBC 7.2  hemoglobin 10.1 hematocrit 29.8 6/14 hemoglobin 10, MCV 84.4 otherwise CBC normal, creatinine 1.48 otherwise BMP normal, albumin 3.1, total protein 5.9 otherwise liver profile normal 3/14 CBC normal 12/13 creatinine 1.17, total protein 5, albumin 3.1 otherwise CMP normal    ASSESSMENT/PLAN:  Hypothyroidism - well controlled  PUD - stable  Insomnia - no complaints  Depression - stable  Constipation - no complaints    CPT CODE: 74259

## 2013-06-19 ENCOUNTER — Other Ambulatory Visit: Payer: Self-pay | Admitting: *Deleted

## 2013-06-19 MED ORDER — OXYCODONE HCL 5 MG PO TABS: ORAL_TABLET | ORAL | Status: DC

## 2013-07-09 ENCOUNTER — Non-Acute Institutional Stay (SKILLED_NURSING_FACILITY): Payer: Medicare Other | Admitting: Adult Health

## 2013-07-09 DIAGNOSIS — K5900 Constipation, unspecified: Secondary | ICD-10-CM

## 2013-07-09 DIAGNOSIS — E039 Hypothyroidism, unspecified: Secondary | ICD-10-CM

## 2013-07-09 DIAGNOSIS — K279 Peptic ulcer, site unspecified, unspecified as acute or chronic, without hemorrhage or perforation: Secondary | ICD-10-CM

## 2013-07-09 DIAGNOSIS — F329 Major depressive disorder, single episode, unspecified: Secondary | ICD-10-CM

## 2013-07-09 DIAGNOSIS — G4700 Insomnia, unspecified: Secondary | ICD-10-CM

## 2013-07-09 DIAGNOSIS — J449 Chronic obstructive pulmonary disease, unspecified: Secondary | ICD-10-CM

## 2013-07-09 NOTE — Progress Notes (Signed)
Patient ID: [name], female   DOB: 1916-11-19, 79 y.o.   MRN: [id number]        PROGRESS NOTE  DATE: 07/09/13  FACILITY: Nursing Home Location: [facility]  LEVEL OF CARE: SNF (31)  Routine Visit  CHIEF COMPLAINT:  Manage depression, peptic ulcer disease, COPD and constipation  HISTORY OF PRESENT ILLNESS:  REASSESSMENT OF ONGOING PROBLEM(S):  DEPRESSION: The depression remains stable. Patient denies ongoing feelings of sadness, insomnia, anedhonia or lack of appetite. No complications reported from the medications currently being used. Staff do not report behavioral problems.  HYPOTHYROIDISM: The hypothyroidism remains stable. No complications noted from the medications presently being used.  The patient denies fatigue or constipation.  6/14 TSH 0.366  CONSTIPATION: The constipation remains stable. No complications from the medications presently being used. Patient denies ongoing constipation, abdominal pain, nausea or vomiting.  PAST MEDICAL HISTORY : Reviewed.  No changes.  CURRENT MEDICATIONS: Reviewed per [facility]  REVIEW OF SYSTEMS:  GENERAL: no change in appetite, no fatigue, no weight changes, no fever, chills or weakness RESPIRATORY: no cough, SOB, DOE, wheezing, hemoptysis CARDIAC: no chest pain, or palpitations, + edema GI: no abdominal pain, diarrhea, constipation, heart burn, nausea or vomiting  PHYSICAL EXAMINATION  VS:  T 98        P 72    RR 16      BP 108/55     POX 95% with O2 at 2 L/minute via [place]    W107.6(Lb)  GENERAL: no acute distress, normal body habitus NECK: supple, trachea midline, no neck masses, no thyroid tenderness, no thyromegaly RESPIRATORY: breathing is even & unlabored, BS CTAB CARDIAC: RRR, no murmur,no extra heart sounds, BLE edema, 1+ GI: abdomen soft, normal BS, no masses, no tenderness, no hepatomegaly, no splenomegaly PSYCHIATRIC: the patient is alert & oriented to person, affect & behavior  appropriate  LABS/RADIOLOGY: 7/14 WBC 7.2 hemoglobin 10.1 hematocrit 29.8 6/14 hemoglobin 10, MCV 84.4 otherwise CBC normal, creatinine 1.48 otherwise BMP normal, albumin 3.1, total protein 5.9 otherwise liver profile normal 3/14 CBC normal 12/13 creatinine 1.17, total protein 5, albumin 3.1 otherwise CMP normal    ASSESSMENT/PLAN:  Hypothyroidism - well controlled  PUD - stable  Insomnia - no complaints  Depression - stable  Constipation - no complaints    CPT CODE: 82956

## 2013-07-16 ENCOUNTER — Other Ambulatory Visit: Payer: Self-pay | Admitting: *Deleted

## 2013-07-16 MED ORDER — OXYCODONE HCL 5 MG PO TABS: ORAL_TABLET | ORAL | Status: DC

## 2013-09-12 ENCOUNTER — Non-Acute Institutional Stay (SKILLED_NURSING_FACILITY): Payer: Medicare Other | Admitting: Internal Medicine

## 2013-09-12 ENCOUNTER — Encounter: Payer: Self-pay | Admitting: Internal Medicine

## 2013-09-12 DIAGNOSIS — J4489 Other specified chronic obstructive pulmonary disease: Secondary | ICD-10-CM

## 2013-09-12 DIAGNOSIS — E039 Hypothyroidism, unspecified: Secondary | ICD-10-CM

## 2013-09-12 DIAGNOSIS — G4700 Insomnia, unspecified: Secondary | ICD-10-CM

## 2013-09-12 DIAGNOSIS — K279 Peptic ulcer, site unspecified, unspecified as acute or chronic, without hemorrhage or perforation: Secondary | ICD-10-CM

## 2013-09-12 DIAGNOSIS — J449 Chronic obstructive pulmonary disease, unspecified: Secondary | ICD-10-CM

## 2013-09-12 NOTE — Progress Notes (Signed)
PROGRESS NOTE  DATE: 09/12/13  FACILITY: [facility]  LEVEL OF CARE: SNF  Routine Visit  CHIEF COMPLAINT:  Manage hypothyroidism and COPD  HISTORY OF PRESENT ILLNESS:  REASSESSMENT OF ONGOING PROBLEMS:  HYPOTHYROIDISM: The hypothyroidism remains stable. No complications noted from the medications presently being used.  The patient denies fatigue or constipation.  Last TSH 0.532 in 12/13, in 6/14 TSH is 0.366, in 9-14 TSH 0.443.Marland Kitchen  COPD: the COPD remains stable.  Pt denies sob, cough, wheezing or declining exercise tolerance.  No complications from the medications presently being used.  PAST MEDICAL HISTORY : Reviewed.  No changes.  CURRENT MEDICATIONS: Reviewed per [facility]  REVIEW OF SYSTEMS:  GENERAL: no change in appetite, no fatigue, no weight changes, no fever, chills or weakness RESPIRATORY: no cough, SOB, DOE,, wheezing, hemoptysis CARDIAC: no chest pain, or palpitations.  Lower extremity swelling present GI: no abdominal pain, diarrhea, constipation, heart burn, nausea or vomiting  PHYSICAL EXAMINATION  VS:  T 98.5      P 72      RR 18      BP 133/76       Pox 95%  GENERAL: no acute distress, normal body habitus NECK: supple, trachea midline, no neck masses, no thyroid tenderness, no thyromegaly RESPIRATORY: breathing is even & unlabored, BS CTAB CARDIAC: RRR, no murmur,no extra heart sounds, +1 bilateral edema GI: abdomen soft, normal BS, no masses, no tenderness, no hepatomegaly, no splenomegaly PSYCHIATRIC: the patient is alert & oriented to person, affect & behavior appropriate MUSCULOSKELETAL: Left shoulder tender to palpation, range of motion moderate, no left upper extremity weakness  LABS/RADIOLOGY:  11-14 hemoglobin 11.1, MCV 90.5 otherwise CBC normal, CO2 33 otherwise BMP normal  6/14 hemoglobin 10, MCV 84.4 otherwise CBC normal, creatinine 1.48 otherwise BMP normal, albumin 3.1, total protein 5.9 otherwise liver profile normal  3/14 CBC  normal 12/13 creatinine 1.17, total protein 5, albumin 3.1 otherwise CMP normal  ASSESSMENT/PLAN:  hypothyroidism-well controlled.   COPD-well compensated. peptic ulcer disease-stable. insomnia-denies symptoms. depression-denies ongoing symptoms. constipation-well-controlled.  CPT CODE: 16109

## 2013-09-18 ENCOUNTER — Other Ambulatory Visit: Payer: Self-pay | Admitting: *Deleted

## 2013-09-18 MED ORDER — OXYCODONE HCL 5 MG PO TABS: ORAL_TABLET | ORAL | Status: AC

## 2013-10-01 ENCOUNTER — Other Ambulatory Visit: Payer: Self-pay | Admitting: *Deleted

## 2013-10-01 MED ORDER — ZOLPIDEM TARTRATE 5 MG PO TABS: ORAL_TABLET | ORAL | Status: AC

## 2013-10-19 ENCOUNTER — Non-Acute Institutional Stay (SKILLED_NURSING_FACILITY): Payer: PRIVATE HEALTH INSURANCE | Admitting: Internal Medicine

## 2013-10-19 DIAGNOSIS — K279 Peptic ulcer, site unspecified, unspecified as acute or chronic, without hemorrhage or perforation: Secondary | ICD-10-CM

## 2013-10-19 DIAGNOSIS — J449 Chronic obstructive pulmonary disease, unspecified: Secondary | ICD-10-CM

## 2013-10-19 DIAGNOSIS — G4700 Insomnia, unspecified: Secondary | ICD-10-CM

## 2013-10-19 DIAGNOSIS — E039 Hypothyroidism, unspecified: Secondary | ICD-10-CM

## 2013-10-19 NOTE — Progress Notes (Signed)
PROGRESS NOTE  DATE: 10/19/13  FACILITY: [facility]  LEVEL OF CARE: SNF  Routine Visit  CHIEF COMPLAINT:  Manage hypothyroidism, insomnia and COPD  HISTORY OF PRESENT ILLNESS:  REASSESSMENT OF ONGOING PROBLEMS:  HYPOTHYROIDISM: The hypothyroidism remains stable. No complications noted from the medications presently being used.  The patient denies fatigue or constipation.  Last TSH 0.532 in 12/13, in 6/14 TSH is 0.366, in 9-14 TSH 0.443, in 11-14 TSH 1.322.  INSOMNIA: The insomnia remains stable.  No complications noted from the medications presently being used. Patient denies ongoing insomnia, pain, hallucinations, delusions.  COPD: the COPD remains stable.  Pt denies sob, cough, wheezing or declining exercise tolerance.  No complications from the medications presently being used.  PAST MEDICAL HISTORY : Reviewed.  No changes.  [facility]ICATION[facility]: [facility]  REVIEW OF SYSTEMS:  GENERAL: no change in appetite, no fatigue, no weight changes, no fever, chills or weakness RESPIRATORY: no cough, SOB, DOE,, wheezing, hemoptysis CARDIAC: no chest pain, or palpitations.  Lower extremity swelling present GI: no abdominal pain, diarrhea, constipation, heart burn, nausea or vomiting  PHYSICAL EXAMINATION  VS:  Not recorded  GENERAL: no acute distress, normal body habitus EYES: Normal sclerae, normal conjunctivae, no discharge NECK: supple, trachea midline, no neck masses, no thyroid tenderness, no thyromegaly LYMPHATICS: No cervical lymphadenopathy, no supraclavicular lymphadenopathy RESPIRATORY: breathing is even & unlabored, BS CTAB CARDIAC: RRR, no murmur,no extra heart sounds, +1 bilateral edema GI: abdomen soft, normal BS, no masses, no tenderness, no hepatomegaly, no splenomegaly PSYCHIATRIC: the patient is alert & oriented to person, affect & behavior appropriate MUSCULOSKELETAL: Left shoulder tender to palpation, range of motion moderate, no left upper  extremity weakness  LABS/RADIOLOGY: 12 -14 hemoglobin 10.9, MCV 92 otherwise CBC normal, hemoglobin A1c 5.5, BUN 30, total protein 5.9, albumin 3.4 otherwise CMP normal, fasting lipid panel normal, TIBC 242, percent saturation 17, serum iron 42, folic acid 15.39, vitamin B12 667  11-14 hemoglobin 11.1, MCV 90.5 otherwise CBC normal, CO2 33 otherwise BMP normal  6/14 hemoglobin 10, MCV 84.4 otherwise CBC normal, creatinine 1.48 otherwise BMP normal, albumin 3.1, total protein 5.9 otherwise liver profile normal  3/14 CBC normal 12/13 creatinine 1.17, total protein 5, albumin 3.1 otherwise CMP normal  ASSESSMENT/PLAN:  hypothyroidism-well controlled.   COPD-well compensated. peptic ulcer disease-stable. insomnia-denies symptoms. depression-denies ongoing symptoms. constipation-well-controlled.  CPT CODE: [id number]

## 2013-11-18 DEATH — deceased
